# Patient Record
Sex: Male | Born: 1960 | Race: Black or African American | Hispanic: No | Marital: Single | Smoking: Never smoker
Health system: Southern US, Community
[De-identification: ages and names within clinical notes are randomized; demographics above are authoritative.]

## PROBLEM LIST (undated history)

## (undated) DIAGNOSIS — G4733 Obstructive sleep apnea (adult) (pediatric): Secondary | ICD-10-CM

## (undated) DIAGNOSIS — E785 Hyperlipidemia, unspecified: Secondary | ICD-10-CM

## (undated) DIAGNOSIS — I10 Essential (primary) hypertension: Secondary | ICD-10-CM

## (undated) DIAGNOSIS — K4090 Unilateral inguinal hernia, without obstruction or gangrene, not specified as recurrent: Secondary | ICD-10-CM

## (undated) DIAGNOSIS — N529 Male erectile dysfunction, unspecified: Secondary | ICD-10-CM

## (undated) DIAGNOSIS — Z87442 Personal history of urinary calculi: Secondary | ICD-10-CM

## (undated) HISTORY — PX: VASECTOMY: SHX75

## (undated) HISTORY — DX: Male erectile dysfunction, unspecified: N52.9

## (undated) HISTORY — DX: Personal history of urinary calculi: Z87.442

## (undated) HISTORY — DX: Hyperlipidemia, unspecified: E78.5

## (undated) HISTORY — DX: Essential (primary) hypertension: I10

## (undated) HISTORY — DX: Obstructive sleep apnea (adult) (pediatric): G47.33

---

## 1999-05-26 HISTORY — PX: OTHER SURGICAL HISTORY: SHX169

## 1999-08-25 ENCOUNTER — Ambulatory Visit (HOSPITAL_BASED_OUTPATIENT_CLINIC_OR_DEPARTMENT_OTHER): Admission: RE | Admit: 1999-08-25 | Discharge: 1999-08-25 | Payer: Self-pay | Admitting: Urology

## 1999-09-28 ENCOUNTER — Ambulatory Visit: Admission: RE | Admit: 1999-09-28 | Discharge: 1999-09-28 | Payer: Self-pay | Admitting: *Deleted

## 1999-10-27 ENCOUNTER — Ambulatory Visit (HOSPITAL_BASED_OUTPATIENT_CLINIC_OR_DEPARTMENT_OTHER): Admission: RE | Admit: 1999-10-27 | Discharge: 1999-10-28 | Payer: Self-pay | Admitting: *Deleted

## 1999-10-27 ENCOUNTER — Encounter (INDEPENDENT_AMBULATORY_CARE_PROVIDER_SITE_OTHER): Payer: Self-pay | Admitting: *Deleted

## 2000-11-30 ENCOUNTER — Ambulatory Visit (HOSPITAL_BASED_OUTPATIENT_CLINIC_OR_DEPARTMENT_OTHER): Admission: RE | Admit: 2000-11-30 | Discharge: 2000-11-30 | Payer: Self-pay | Admitting: *Deleted

## 2003-05-01 ENCOUNTER — Ambulatory Visit (HOSPITAL_COMMUNITY): Admission: RE | Admit: 2003-05-01 | Discharge: 2003-05-01 | Payer: Self-pay | Admitting: *Deleted

## 2004-09-29 ENCOUNTER — Ambulatory Visit: Payer: Self-pay | Admitting: Internal Medicine

## 2004-10-01 ENCOUNTER — Ambulatory Visit: Payer: Self-pay | Admitting: Internal Medicine

## 2006-03-02 ENCOUNTER — Ambulatory Visit: Payer: Self-pay | Admitting: Internal Medicine

## 2006-03-02 LAB — CONVERTED CEMR LAB
Albumin: 4.4 g/dL (ref 3.5–5.2)
Alkaline Phosphatase: 82 units/L (ref 39–117)
Basophils Absolute: 0.1 10*3/uL (ref 0.0–0.1)
Bilirubin Urine: NEGATIVE
Chol/HDL Ratio, serum: 7.4
Cholesterol: 292 mg/dL (ref 0–200)
Creatinine, Ser: 1.3 mg/dL (ref 0.4–1.5)
Eosinophil percent: 0.8 % (ref 0.0–5.0)
Glucose, Bld: 94 mg/dL (ref 70–99)
HCT: 45.8 % (ref 39.0–52.0)
Hemoglobin, Urine: NEGATIVE
Ketones, ur: NEGATIVE mg/dL
LDL DIRECT: 230.4 mg/dL
Leukocytes, UA: NEGATIVE
MCV: 82.1 fL (ref 78.0–100.0)
Monocytes Absolute: 0.6 10*3/uL (ref 0.2–0.7)
Neutrophils Relative %: 73.6 % (ref 43.0–77.0)
Platelets: 247 10*3/uL (ref 150–400)
RDW: 13.9 % (ref 11.5–14.6)
TSH: 1.02 microintl units/mL (ref 0.35–5.50)
Total Bilirubin: 1.3 mg/dL — ABNORMAL HIGH (ref 0.3–1.2)
Total Protein: 7.2 g/dL (ref 6.0–8.3)
Urine Glucose: NEGATIVE mg/dL
VLDL: 16 mg/dL (ref 0–40)
WBC: 11.1 10*3/uL — ABNORMAL HIGH (ref 4.5–10.5)
pH: 6 (ref 5.0–8.0)

## 2006-03-03 ENCOUNTER — Ambulatory Visit: Payer: Self-pay | Admitting: Internal Medicine

## 2009-06-19 ENCOUNTER — Ambulatory Visit: Payer: Self-pay | Admitting: Internal Medicine

## 2009-06-19 DIAGNOSIS — N529 Male erectile dysfunction, unspecified: Secondary | ICD-10-CM | POA: Insufficient documentation

## 2009-06-19 DIAGNOSIS — I10 Essential (primary) hypertension: Secondary | ICD-10-CM | POA: Insufficient documentation

## 2009-06-19 DIAGNOSIS — G4733 Obstructive sleep apnea (adult) (pediatric): Secondary | ICD-10-CM | POA: Insufficient documentation

## 2009-06-19 DIAGNOSIS — E785 Hyperlipidemia, unspecified: Secondary | ICD-10-CM | POA: Insufficient documentation

## 2009-07-24 ENCOUNTER — Encounter: Admission: RE | Admit: 2009-07-24 | Discharge: 2009-07-24 | Payer: Self-pay | Admitting: Surgery

## 2010-06-22 LAB — CONVERTED CEMR LAB
ALT: 18 units/L (ref 0–53)
AST: 18 units/L (ref 0–37)
Basophils Relative: 0.8 % (ref 0.0–3.0)
Bilirubin Urine: NEGATIVE
Creatinine, Ser: 1.3 mg/dL (ref 0.4–1.5)
Eosinophils Relative: 1.2 % (ref 0.0–5.0)
HCT: 44.7 % (ref 39.0–52.0)
Ketones, ur: NEGATIVE mg/dL
Lymphocytes Relative: 21.9 % (ref 12.0–46.0)
MCV: 80.3 fL (ref 78.0–100.0)
Monocytes Relative: 7.7 % (ref 3.0–12.0)
Neutrophils Relative %: 68.4 % (ref 43.0–77.0)
Potassium: 4 meq/L (ref 3.5–5.1)
RBC: 5.56 M/uL (ref 4.22–5.81)
Sodium: 138 meq/L (ref 135–145)
Specific Gravity, Urine: 1.015 (ref 1.000–1.030)
TSH: 1.2 microintl units/mL (ref 0.35–5.50)
Total Protein, Urine: NEGATIVE mg/dL
Total Protein: 7.4 g/dL (ref 6.0–8.3)
Urine Glucose: NEGATIVE mg/dL
WBC: 7.1 10*3/uL (ref 4.5–10.5)
pH: 7 (ref 5.0–8.0)

## 2010-06-26 NOTE — Assessment & Plan Note (Signed)
Summary: re-establish/cpx/#/cd   Vital Signs:  Patient profile:   50 year old male Height:      73 inches Weight:      234 pounds BMI:     30.98 O2 Sat:      98 % on Room air Temp:     98.6 degrees F oral Pulse rate:   72 / minute BP sitting:   132 / 92  (left arm) Cuff size:   large  Vitals Entered ByMarland Kitchen Zella Ball Ewing (June 19, 2009 9:17 AM)  O2 Flow:  Room air  Preventive Care Screening     declines flu shot todahy  CC: adult physical/Re   CC:  adult physical/Re.  History of Present Illness: overall dong well, has colon test planned for feb9;  BP has been elevated up to 150 at times;  has been tryign to follow lower chol diet recently; Pt denies CP, sob, doe, wheezing, orthopnea, pnd, worsening LE edema, palps, dizziness or syncope  Pt denies new neuro symptoms such as headache, facial or extremity weakness   Preventive Screening-Counseling & Management  Alcohol-Tobacco     Smoking Status: quit  Problems Prior to Update: None  Medications Prior to Update: 1)  None  Current Medications (verified): 1)  Cialis 20 Mg Tabs (Tadalafil) .Marland Kitchen.. 1 By Mouth Every Other Day As Needed 2)  Aspir-Low 81 Mg Tbec (Aspirin) .Marland Kitchen.. 1po Once Daily 3)  Losartan Potassium 50 Mg Tabs (Losartan Potassium) .Marland Kitchen.. 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 sister with breast cancer - died at 77yo mother died with stroke at 17yo; HTN, glaucoma father died at 66yo - [place]  Social History: Reviewed history and no changes required. 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         all otherwise negative per pt -   Physical Exam  General:  alert and overweight-appearing.   Head:  normocephalic and atraumatic.   Eyes:  vision grossly intact, pupils equal, and pupils round.   Ears:  R ear normal and L ear normal.   Nose:  no external deformity and no nasal discharge.   Mouth:  no gingival abnormalities and pharynx pink and moist.   Neck:  supple and no masses.   Lungs:  normal respiratory effort and normal breath sounds.   Heart:  normal rate and regular rhythm.   Abdomen:  soft, non-tender, and  normal bowel sounds.   Msk:  no joint tenderness and no joint swelling.   Extremities:  no edema, no erythema  Neurologic:  cranial nerves II-XII intact and strength normal in all extremities.     Impression & Recommendations:  Problem # 1:  Preventive Health Care (ICD-V70.0) Overall doing well, age appropriate education and counseling updated and referral for appropriate preventive services done unless declined, immunizations up to date or declined, diet counseling done if  overweight, urged to quit smoking if smokes , most recent labs reviewed and current ordered if appropriate, ecg reviewed or declined (interpretation per ECG scanned in the EMR if done); information regarding Medicare Prevention requirements given if appropriate  Orders: EKG w/ Interpretation (93000)  Problem # 2:  HYPERTENSION (ICD-401.9)  BP today: 132/92  Labs Reviewed: K+: 3.5 (03/02/2006) Creat: : 1.3 (03/02/2006)   Chol: 292 (03/02/2006)   HDL: 39.6 (03/02/2006)   TG: 78 (03/02/2006) to start the losartan 50 mg per day  His updated medication list for this problem includes:    Losartan Potassium 50 Mg Tabs (Losartan potassium) .Marland Kitchen... 1 by mouth once daily  Problem # 3:  HYPERLIPIDEMIA (ICD-272.4) conisder start lipitor; but will need f/u of labs drawn earlier today  Problem # 4:  ERECTILE DYSFUNCTION, ORGANIC (ICD-607.84)  His updated medication list for this problem includes:    Cialis 20 Mg Tabs (Tadalafil) .Marland Kitchen... 1 by mouth every other day as needed treat as above, f/u any worsening signs or symptoms   Complete Medication List: 1)  Cialis 20 Mg Tabs (Tadalafil) .Marland Kitchen.. 1 by mouth every other day as needed 2)  Aspir-low 81 Mg Tbec (Aspirin) .Marland Kitchen.. 1po once daily 3)  Losartan Potassium 50 Mg Tabs (Losartan potassium) .Marland Kitchen.. 1 by mouth once daily  Patient Instructions: 1)  please make appt with opthomology for vision and glaucoma check yearly 2)  please call the number on the blue card for the lab results 3)  we'll call if it seems you need the lipitor 4)  please keep your appt with GI feb 9 for the colonoscopy as planned with Dr Madilyn Fireman 5)  Take an Aspirin every day - 81 mg - 1 per day - COATED only 6)  start the losartan 50 mg per day 7)  Continue all previous medications as before this visit  8)  Please schedule a follow-up appointment in 1 yr  for yearly exam, or [place] if needed 9)  Check your Blood Pressure regularly. If it is above 140/90: you should make an  appointment. Prescriptions: LOSARTAN POTASSIUM 50 MG TABS (LOSARTAN POTASSIUM) 1 by mouth once daily  #90 x 3   Entered and Authorized by:   Corwin Levins MD   Signed by:   Corwin Levins MD on 06/19/2009   Method used:   Print then Give to Patient   RxID:   [id number] CIALIS 20 MG TABS (TADALAFIL) 1 by mouth every other day as needed  #5 x 11   Entered and Authorized by:   Corwin Levins MD   Signed by:   Corwin Levins MD on 06/19/2009   Method used:   Print then Give to Patient   RxID:   [id number] CIALIS 20 MG TABS (TADALAFIL) 1 by mouth every other day as needed  #3 x 0   Entered and Authorized by:   Corwin Levins MD   Signed by:   Corwin Levins MD on 06/19/2009   Method used:   Print then  Give to Patient   RxID:   [id number]    Immunization History:  Tetanus/Td Immunization History:    Tetanus/Td:  historical (05/25/2001)  Pneumovax Immunization History:    Pneumovax:  historical (05/25/2001)

## 2010-06-27 ENCOUNTER — Ambulatory Visit: Admit: 2010-06-27 | Payer: Self-pay | Admitting: Internal Medicine

## 2010-06-27 ENCOUNTER — Other Ambulatory Visit: Payer: Self-pay

## 2010-07-02 ENCOUNTER — Other Ambulatory Visit: Payer: Self-pay | Admitting: Internal Medicine

## 2010-07-02 ENCOUNTER — Other Ambulatory Visit: Payer: 59

## 2010-07-02 ENCOUNTER — Encounter (INDEPENDENT_AMBULATORY_CARE_PROVIDER_SITE_OTHER): Payer: Self-pay | Admitting: *Deleted

## 2010-07-02 DIAGNOSIS — Z1289 Encounter for screening for malignant neoplasm of other sites: Secondary | ICD-10-CM

## 2010-07-02 DIAGNOSIS — Z0000 Encounter for general adult medical examination without abnormal findings: Secondary | ICD-10-CM

## 2010-07-02 DIAGNOSIS — E785 Hyperlipidemia, unspecified: Secondary | ICD-10-CM

## 2010-07-02 LAB — HEPATIC FUNCTION PANEL
AST: 22 U/L (ref 0–37)
Albumin: 4.2 g/dL (ref 3.5–5.2)
Alkaline Phosphatase: 75 U/L (ref 39–117)
Bilirubin, Direct: 0.1 mg/dL (ref 0.0–0.3)
Total Bilirubin: 1.1 mg/dL (ref 0.3–1.2)

## 2010-07-02 LAB — LIPID PANEL: VLDL: 15.4 mg/dL (ref 0.0–40.0)

## 2010-07-02 LAB — CBC WITH DIFFERENTIAL/PLATELET
Basophils Absolute: 0 10*3/uL (ref 0.0–0.1)
Eosinophils Absolute: 0.1 10*3/uL (ref 0.0–0.7)
Eosinophils Relative: 1.1 % (ref 0.0–5.0)
HCT: 44.2 % (ref 39.0–52.0)
Hemoglobin: 14.7 g/dL (ref 13.0–17.0)
MCHC: 33.3 g/dL (ref 30.0–36.0)
MCV: 80 fl (ref 78.0–100.0)
Monocytes Absolute: 0.6 10*3/uL (ref 0.1–1.0)
Monocytes Relative: 5.6 % (ref 3.0–12.0)
RBC: 5.52 Mil/uL (ref 4.22–5.81)
WBC: 9.9 10*3/uL (ref 4.5–10.5)

## 2010-07-02 LAB — URINALYSIS
Leukocytes, UA: NEGATIVE
Total Protein, Urine: NEGATIVE
Urine Glucose: NEGATIVE
Urobilinogen, UA: 0.2 (ref 0.0–1.0)
pH: 6 (ref 5.0–8.0)

## 2010-07-02 LAB — BASIC METABOLIC PANEL
BUN: 22 mg/dL (ref 6–23)
CO2: 25 mEq/L (ref 19–32)
Calcium: 9.5 mg/dL (ref 8.4–10.5)
Creatinine, Ser: 1.2 mg/dL (ref 0.4–1.5)

## 2010-07-02 LAB — LDL CHOLESTEROL, DIRECT: Direct LDL: 143.8 mg/dL

## 2010-07-02 LAB — PSA: PSA: 0.83 ng/mL (ref 0.10–4.00)

## 2010-07-04 ENCOUNTER — Encounter: Payer: Self-pay | Admitting: Internal Medicine

## 2010-07-04 ENCOUNTER — Encounter (INDEPENDENT_AMBULATORY_CARE_PROVIDER_SITE_OTHER): Payer: 59 | Admitting: Internal Medicine

## 2010-07-04 DIAGNOSIS — Z0000 Encounter for general adult medical examination without abnormal findings: Secondary | ICD-10-CM

## 2010-07-10 NOTE — Assessment & Plan Note (Signed)
Summary: PHYSICAL--STC   Vital Signs:  Patient profile:   50 year old male Height:      73 inches (185.42 cm) Weight:      245.0 pounds (111.36 kg) BMI:     32.44 O2 Sat:      97 % on Room air Temp:     98.2 degrees F (36.78 degrees C) oral Pulse rate:   69 / minute BP sitting:   118 / 82  (left arm) Cuff size:   large  Vitals Entered By: Orlan Leavens RMA (July 04, 2010 1:22 PM)  O2 Flow:  Room air  Preventive Care Screening  Colonoscopy:    Date:  08/23/2009    Next Due:  08/2019    Results:  normal   CC: CPX Is Patient Diabetic? No Pain Assessment Patient in pain? no        CC:  CPX.  History of Present Illness: here for wellness and f/u;  overall doing ok;  Pt denies CP, worsening sob, doe, wheezing, orthopnea, pnd, worsening LE edema, palps, dizziness or syncope  Pt denies new neuro symptoms such as headache, facial or extremity weakness  Pt denies polydipsia, polyuria,  Overall good compliance with meds, trying to follow low chol diet, wt stable, little excercise however .Overall good compliance with meds, and good tolerability, except admits to some noncompliacne with the lipitor..  No fever, wt loss, night sweats, loss of appetite or other constitutional symptoms  Denies worsening depressive symptoms, suicidal ideation, or panic.   Pt states good ability with ADL's, low fall risk, home safety reviewed and adequate, no significant change in hearing or vision, trying to follow lower chol diet, and occasionally active only with regular excercise.  Not taking the ASA as well.   Preventive Screening-Counseling & Management      Drug Use:  no.    Problems Prior to Update: 1)  Special Screening Malig Neoplasms Other Sites  (ICD-V76.49) 2)  Preventive Health Care  (ICD-V70.0) 3)  Erectile Dysfunction, Organic  (ICD-607.84) 4)  Hypertension  (ICD-401.9) 5)  Hyperlipidemia  (ICD-272.4) 6)  Sleep Apnea, Obstructive  (ICD-327.23)  Medications Prior to Update: 1)   Cialis 20 Mg Tabs (Tadalafil) .Marland Kitchen.. 1 By Mouth Every Other Day As Needed 2)  Aspir-Low 81 Mg Tbec (Aspirin) .Marland Kitchen.. 1po Once Daily 3)  Losartan Potassium 50 Mg Tabs (Losartan Potassium) .Marland Kitchen.. 1 By Mouth Once Daily 4)  Lipitor 20 Mg Tabs (Atorvastatin Calcium) .Marland Kitchen.. 1 By Mouth Once Daily  Current Medications (verified): 1)  Cialis 20 Mg Tabs (Tadalafil) .Marland Kitchen.. 1 By Mouth Every Other Day As Needed 2)  Aspir-Low 81 Mg Tbec (Aspirin) .Marland Kitchen.. 1po Once Daily 3)  Losartan Potassium 50 Mg Tabs (Losartan Potassium) .Marland Kitchen.. 1 By Mouth Once Daily 4)  Lipitor 20 Mg Tabs (Atorvastatin Calcium) .Marland Kitchen.. 1 By Mouth Once Daily  Allergies (verified): No Known Drug Allergies  Past History:  Past Medical History: Last updated: 06/19/2009 OSA - s/p surgury Hyperlipidemia Hypertension erectile dysfunction   Past Surgical History: Last updated: 06/19/2009 s/p deviated nasal septum 2001 s/p uvulopaltoplasty 2001 Vasectomy - dr Brunilda Payor  Family History: Last updated: 06/19/2009 sister with breast cancer - died at 63yo mother died with stroke at 54yo; HTN, glaucoma father died at 21yo - [place]  Social History: Last updated: 07/04/2010 Former Smoker - in high school only Alcohol use-yes - rare Divorced/girlfriend no children 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[employer])  Risk Factors: Smoking Status: quit (06/19/2009)  Social History: Former Smoker - in high school only Alcohol use-yes - rare Divorced/girlfriend no children retired Psychologist, 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         all otherwise negative per pt -    Physical Exam  General:  alert and overweight-appearing.   Head:  normocephalic and atraumatic.   Eyes:  vision grossly intact, pupils equal, and pupils round.   Ears:  R ear normal and L ear normal.   Nose:  no external deformity and no nasal discharge.   Mouth:  no gingival abnormalities and pharynx pink and moist.   Neck:  supple and no masses.   Lungs:  normal respiratory effort and normal breath sounds.   Heart:  normal rate and regular rhythm.   Abdomen:  soft, non-tender, and normal bowel sounds.   Msk:  no joint tenderness and no joint swelling.   Extremities:  no edema, no erythema  Neurologic:  cranial nerves II-XII intact and strength normal in all extremities.   Skin:  color normal and no rashes.   Psych:  not anxious appearing and not depressed appearing.     Impression & Recommendations:  Problem # 1:  Preventive Health Care (ICD-V70.0)  Overall doing well, age appropriate education and counseling updated, referral for preventive services and immunizations addressed, dietary counseling and smoking status adressed , most recent labs reviewed, ecg reviewed I have personally reviewed and have noted 1.The patient's medical and social history 2.Their use of alcohol, tobacco or illicit drugs 3.Their current medications and supplements 4. Functional ability including ADL's, fall risk, home safety risk, hearing & visual impairment  5.Diet and physical activities 6.Evidence for depression or mood disorders The patients weight, height, BMI  have been recorded in the chart I have made referrals, counseling and provided education to the patient based review of the above   Orders: EKG w/ Interpretation (93000)  Complete Medication List: 1)  Cialis 20 Mg Tabs (Tadalafil) .Marland Kitchen.. 1 by mouth every  other day as needed 2)  Aspir-low 81 Mg Tbec (Aspirin) .Marland Kitchen.. 1po once daily 3)  Losartan Potassium 50 Mg Tabs (Losartan potassium) .Marland Kitchen.. 1 by mouth once daily 4)  Lipitor 20 Mg Tabs (Atorvastatin calcium) .Marland Kitchen.. 1 by mouth once daily  Patient Instructions: 1)  Continue all previous medications as before this visit 2)  Take an Aspirin every day - 81 mg - 1 per day - COATED only 3)  You are given the refills today 4)  Please schedule a follow-up appointment in 1 year, or sooner if needed 5)  Good Luck with your new job! Prescriptions: CIALIS 20 MG TABS (TADALAFIL) 1 by mouth every other day as needed  #5 x 11   Entered and Authorized by:   Corwin Levins MD   Signed by:   Corwin Levins MD on 07/04/2010   Method used:   Print then Give to Patient   RxID:   [id number] LOSARTAN POTASSIUM 50 MG TABS (LOSARTAN POTASSIUM) 1 by mouth once daily  #90 x 3   Entered  and Authorized by:   Corwin Levins MD   Signed by:   Corwin Levins MD on 07/04/2010   Method used:   Print then Give to Patient   RxID:   [id number] LIPITOR 20 MG TABS (ATORVASTATIN CALCIUM) 1 by mouth once daily  #90 x 3   Entered and Authorized by:   Corwin Levins MD   Signed by:   Corwin Levins MD on 07/04/2010   Method used:   Print then Give to Patient   RxID:   [id number]    Orders Added: 1)  EKG w/ Interpretation [93000] 2)  Est. Patient 40-64 years [36644]

## 2010-10-10 NOTE — Op Note (Signed)
[place]. [facility]  Patient:    Alejandro Middleton, Alejandro Middleton                      MRN: [id number] Proc. Date: 08/25/99 Attending:  Lindaann Slough, M.D.                           Operative Report  PREOPERATIVE DIAGNOSIS:  Redundant foreskin.  POSTOPERATIVE DIAGNOSIS:  Redundant foreskin.  PROCEDURE:  Circumcision.  SURGEON:  Lindaann Slough, M.D.  ANESTHESIA:  General.  INDICATIONS:  The patient is a 50 year old male who has been complaining of irritation of his foreskin.  He has not been circumcised.  He would like to have it done.  He is scheduled today for the procedure.  DESCRIPTION OF PROCEDURE:  Under general anesthesia the patient was prepped and  draped and placed in the supine position.  Two circumferential incisions were made on the foreskin and the foreskin in between those two incisions was excised. Hemostasis was secured with electrocautery.  The skin approximation was then done with #4-0 chromic.  A penile block was then done with 0.25% Marcaine.  The patient tolerated the procedure well and left the operating room in satisfactory condition to the post anesthesia care unit. DD:  08/25/99 TD:  08/25/99 Job: [id number]

## 2010-10-10 NOTE — Op Note (Signed)
[place]. [facility]  Patient:    Alejandro Middleton, Alejandro Middleton                       MRN: [id number] Proc. Date: 10/27/99 Adm. Date:  [id number] Attending:  Claudina Lick                           Operative Report  PREOPERATIVE DIAGNOSES: 1. Obstructive sleep apnea syndrome. 2. Deviated nasal septum. 3. Nasal turbinate hypertrophy.  POSTOPERATIVE DIAGNOSES: 1. Obstructive sleep apnea syndrome. 2. Deviated nasal septum. 3. Nasal turbinate hypertrophy.  OPERATIONS PERFORMED: 1. Uvulopalatoplasty. 2. Nasal septoplasty. 3. Submucous resection right inferior nasal turbinate.  SURGEON:  Robert L. Lyman Bishop, M.D.  ANESTHESIA:  General.  PROCEDURE:  A 50 year old black male complaining of history of snoring. Examination showed a marked septal deviation to the left with enlarged right inferior nasal turbinate.  Had a thickened soft palate and uvula, which was also elongated and a sleep study showed moderately severe obstructive sleep apnea.  Patient admitted for surgery.  After satisfactory general endotracheal anesthesia had been induced, topical epinephrine packs were placed intranasally, afterwhich, the nasal septum, the right inferior nasal turbinate were infiltrated with 1% Xylocaine containing 1:100,000 epinephrine for hemostasis.  This was also infiltrated into the lower soft palate.  Nose and face were prepped with Betadine and sterile drapes applied.  A left anterior septal incision was made.  The mucoperichondrium periosteum elevated initially on the left side.  The deflected inferior border of the cartilage was dissected up from the maxillary crest and the cartilage then partially separated from the bony septum and the mucoperiosteal flap on the right side posteriorly was then elevated.  Deviated portion of the perpendicular plate along with a vomerine spur was removed with Jansen-Middleton bone biting forceps.  The inferior border of the  cartilage was trimmed removing the portion displaced to the left and the small spur from the left side of the maxillary crest was removed with a chisel.  Posterior septal flaps reapproximated with mattress suture of 4-0 plain gut.  The cartilage was stabilized in the midline over the maxillary crest with two mattress sutures of 4-0 plain gut placed in the [facility] technique.  Afterwhich, the septal incision was closed with running 5-0 chromic gut.  The left inferior nasal turbinate was crushed and lateralized.  A small incision made over the anterior end of the right inferior nasal turbinate.  The mucoperiosteum elevated, excess turbinate bone removed and the incision closed with a running 5-0 chromic gut.  Each side of the nose was then packed with folded strip of Telfa gauze coated with Cortisporin ointment.  A Crowe-Davis mouthgag was then inserted and suspended from the Mayo stand.  Patient had moderately enlarged, cryptic tonsils with thickened, elongated uvula.  An incision was made across the soft palate extending from the superior pole of the left tonsil to the superior pole of the right tonsil and the lower 2-3 mm of soft palate along with the entire uvula was excised with a scalpel and the anterior and posterior mucosal margins were sutured together with a running 4-0 chromic gut.  Depo Medrol 40 mg was infiltrated into the remaining soft palate.  Patient was given 1 g of Ancef IV intraoperative.  Estimated blood loss 15-20 cc.  Patient tolerated the procedure well, was awakened from anesthesia and taken to the recovery room in satisfactory condition. DD:  10/27/99 TD:  10/29/99 Job: [id number]

## 2010-10-30 ENCOUNTER — Other Ambulatory Visit (HOSPITAL_COMMUNITY): Payer: Self-pay | Admitting: Urology

## 2010-10-30 DIAGNOSIS — K8020 Calculus of gallbladder without cholecystitis without obstruction: Secondary | ICD-10-CM

## 2010-11-04 ENCOUNTER — Ambulatory Visit (HOSPITAL_COMMUNITY)
Admission: RE | Admit: 2010-11-04 | Discharge: 2010-11-04 | Disposition: A | Discharge status: Home / Self Care | Payer: 59 | Source: Ambulatory Visit | Attending: Urology | Admitting: Urology

## 2010-11-04 ENCOUNTER — Other Ambulatory Visit (HOSPITAL_COMMUNITY): Payer: 59

## 2010-11-04 DIAGNOSIS — K8020 Calculus of gallbladder without cholecystitis without obstruction: Secondary | ICD-10-CM

## 2010-11-04 DIAGNOSIS — R1011 Right upper quadrant pain: Secondary | ICD-10-CM | POA: Insufficient documentation

## 2010-11-04 DIAGNOSIS — D1803 Hemangioma of intra-abdominal structures: Secondary | ICD-10-CM | POA: Insufficient documentation

## 2010-12-11 ENCOUNTER — Encounter: Payer: Self-pay | Admitting: Internal Medicine

## 2010-12-11 ENCOUNTER — Telehealth: Payer: Self-pay | Admitting: *Deleted

## 2010-12-11 NOTE — Telephone Encounter (Signed)
We have no documentation regarding CAD or PVD  Please clarify how this diagnosis was made, so that referral can be done and we can obtain records

## 2010-12-11 NOTE — Telephone Encounter (Signed)
Request for referral to Cardiology [clogged arteries]

## 2010-12-12 ENCOUNTER — Ambulatory Visit (INDEPENDENT_AMBULATORY_CARE_PROVIDER_SITE_OTHER)
Admission: RE | Admit: 2010-12-12 | Discharge: 2010-12-12 | Disposition: A | Discharge status: Home / Self Care | Payer: 59 | Source: Ambulatory Visit | Attending: Internal Medicine | Admitting: Internal Medicine

## 2010-12-12 ENCOUNTER — Ambulatory Visit (INDEPENDENT_AMBULATORY_CARE_PROVIDER_SITE_OTHER): Payer: 59 | Admitting: Internal Medicine

## 2010-12-12 ENCOUNTER — Encounter: Payer: Self-pay | Admitting: Internal Medicine

## 2010-12-12 DIAGNOSIS — E785 Hyperlipidemia, unspecified: Secondary | ICD-10-CM

## 2010-12-12 DIAGNOSIS — R079 Chest pain, unspecified: Secondary | ICD-10-CM | POA: Insufficient documentation

## 2010-12-12 DIAGNOSIS — Z0000 Encounter for general adult medical examination without abnormal findings: Secondary | ICD-10-CM

## 2010-12-12 DIAGNOSIS — I10 Essential (primary) hypertension: Secondary | ICD-10-CM

## 2010-12-12 DIAGNOSIS — Z0001 Encounter for general adult medical examination with abnormal findings: Secondary | ICD-10-CM | POA: Insufficient documentation

## 2010-12-12 MED ORDER — LOSARTAN POTASSIUM 50 MG PO TABS: 50.0000 mg | ORAL_TABLET | Freq: Every day | ORAL | Status: DC

## 2010-12-12 MED ORDER — ATORVASTATIN CALCIUM 40 MG PO TABS: 40.0000 mg | ORAL_TABLET | Freq: Every day | ORAL | Status: DC

## 2010-12-12 NOTE — Patient Instructions (Addendum)
Please increase the lipitor to 40 mg per day Continue all other medications as before, including the losartan Your EKG was good today Please go to XRAY in the Basement for the x-ray test Please call the phone number [phone] (the PhoneTree System) for results of testing in 2-3 days;  When calling, simply dial the number, and when prompted enter the MRN number above (the Medical Record Number) and the # key, then the message should start. You will be contacted regarding the referral for: Cardiology Please return in Feb 2013  for your yearly visit, or sooner if needed, with Lab testing done 3-5 days before

## 2010-12-12 NOTE — Progress Notes (Signed)
Quick Note:  Voice message left on PhoneTree system - lab is negative, normal or otherwise stable, pt to continue same tx ______ 

## 2010-12-12 NOTE — Assessment & Plan Note (Addendum)
Uncontrolled - for incr lipitor to 40 mg, cont diet, f/u labs  Last ldl 143 on 20 mg lipitor

## 2010-12-12 NOTE — Assessment & Plan Note (Signed)
stable overall by hx and exam, most recent data reviewed with pt, and pt to continue medical treatment as before  BP Readings from Last 3 Encounters:  12/12/10 122/84  07/04/10 118/82  06/19/09 132/92

## 2010-12-12 NOTE — Telephone Encounter (Signed)
Called the patient he has had no test done. He is just worried and would like to see a cardiologist.

## 2010-12-12 NOTE — Assessment & Plan Note (Signed)
Atypical- ? MSK vs other;  ecg reviewed - sinus with NSSTTW changes, also for CXR, and pt requests card referral

## 2010-12-12 NOTE — Progress Notes (Signed)
  Subjective:    Patient ID: Alejandro Middleton, male    DOB: Jan 21, 1961, 50 y.o.   MRN: [id number]  HPI  Here to f/u;  Phoned in request yesterday that did not mention CP, and wanted "cardiology tests."  Today states he has been lately very concerned about his CV risk given his risk factors and age;  States has had last wk intermittent left upper chest discomfort, dull and sharp without radiation, n/v, sob, diaphoresis, palps, dizzines, syncope, non exertional but nonpleuritic as well.  Nothing seems to make worse or better, and distinct from typical reflux.  Pt denies wheezing, orthopnea, PND, increased LE swelling.  Pt denies new neurological symptoms such as new headache, or facial or extremity weakness or numbness    Pt denies polydipsia, polyuria, or low sugar symptoms such as weakness or confusion improved with po intake.  Pt states overall good compliance with meds, trying to follow lower cholesterol diet, wt overall stable but little exercise however.    Past Medical History  Diagnosis Date  . Hyperlipidemia   . Hypertension   . Erectile dysfunction   . OSA (obstructive sleep apnea)     s/p surgery   Past Surgical History  Procedure Date  . S/p deviated nasal septum 2001  . S/p uvulopaltoplasty 2001  . Vasectomy     Dr. Brunilda Payor    reports that he has quit smoking. He does not have any smokeless tobacco history on file. He reports that he drinks alcohol. He reports that he does not use illicit drugs. family history includes Cancer in his sister; Glaucoma in his mother; Heart attack (age of onset:49) in his father; Hypertension in his mother; and Stroke (age of onset:90) in his mother. No Known Allergies Current Outpatient Prescriptions on File Prior to Visit  Medication Sig Dispense Refill  . aspirin 81 MG EC tablet Take 81 mg by mouth daily.        . tadalafil (CIALIS) 20 MG tablet Take 20 mg by mouth every other day as needed.         Review of Systems Review of Systems    Constitutional: Negative for diaphoresis and unexpected weight change.  HENT: Negative for drooling and tinnitus.   Eyes: Negative for photophobia and visual disturbance.  Respiratory: Negative for choking and stridor.   Gastrointestinal: Negative for vomiting and blood in stool.       Objective:   Physical Exam BP 122/84  Pulse 72  Temp(Src) 98.7 F (37.1 C) (Oral)  Ht 6\' 1"  (1.854 m)  Wt 246 lb 4 oz (111.698 kg)  BMI 32.49 kg/m2  SpO2 95% Physical Exam  VS noted Constitutional: Pt appears well-developed and well-nourished.  HENT: Head: Normocephalic.  Right Ear: External ear normal.  Left Ear: External ear normal.  Eyes: Conjunctivae and EOM are normal. Pupils are equal, round, and reactive to light.  Neck: Normal range of motion. Neck supple.  Cardiovascular: Normal rate and regular rhythm.   Pulmonary/Chest: Effort normal and breath sounds normal.  Abd:  Soft, NT, non-distended, + BS Neurological: Pt is alert. No cranial nerve deficit.  Skin: Skin is warm. No erythema.  Psychiatric: Pt behavior is normal. Thought content normal.         Assessment & Plan:

## 2011-01-08 ENCOUNTER — Encounter: Payer: Self-pay | Admitting: Cardiology

## 2011-01-08 ENCOUNTER — Ambulatory Visit (INDEPENDENT_AMBULATORY_CARE_PROVIDER_SITE_OTHER): Payer: 59 | Admitting: Cardiology

## 2011-01-08 DIAGNOSIS — E785 Hyperlipidemia, unspecified: Secondary | ICD-10-CM

## 2011-01-08 DIAGNOSIS — R079 Chest pain, unspecified: Secondary | ICD-10-CM

## 2011-01-08 DIAGNOSIS — I10 Essential (primary) hypertension: Secondary | ICD-10-CM

## 2011-01-08 NOTE — Progress Notes (Signed)
HPI: 50 year old male with no prior cardiac history for evaluation of chest pain. Several weeks ago the patient had an episode of chest pain. He had moved a piece of furniture. He then developed a sharp pain in the left upper chest. It lasted for 15 minutes and resolved spontaneously. It was not pleuritic, positional or related to food. The pain did not radiate. There was mild nausea and diaphoresis but no shortness of breath. The following day he had milder pain. He said no symptoms since. He presently denies dyspnea on exertion, orthopnea, PND, pedal edema, palpitations or syncope. No exertional chest pain. Because of the above we were asked to further evaluate.  Current Outpatient Prescriptions  Medication Sig Dispense Refill  . aspirin 81 MG EC tablet Take 81 mg by mouth daily.        Marland Kitchen atorvastatin (LIPITOR) 40 MG tablet Take 1 tablet (40 mg total) by mouth daily.  90 tablet  3  . losartan (COZAAR) 50 MG tablet Take 1 tablet (50 mg total) by mouth daily.  90 tablet  3  . VIAGRA 100 MG tablet         No Known Allergies  Past Medical History  Diagnosis Date  . Hyperlipidemia   . Hypertension   . Erectile dysfunction   . OSA (obstructive sleep apnea)     s/p surgery    Past Surgical History  Procedure Date  . S/p deviated nasal septum 2001  . S/p uvulopaltoplasty 2001  . Vasectomy     Dr. Brunilda Payor    History   Social History  . Marital Status: Single    Spouse Name: N/A    Number of Children: 1  . Years of Education: N/A   Occupational History  . 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 Smoking status: Former Games developer  . Smokeless tobacco: Not on file   Comment: high school only  . Alcohol Use: Yes     rare  . Drug Use: No  . Sexually Active: Not on file   Other Topics Concern  . Not on file   Social History Narrative   Divorced/girlfriend    Family History  Problem  Relation Age of Onset  . Cancer Sister     breast  . Hypertension Mother   . Glaucoma Mother   . Stroke Mother 46  . Heart attack Father 49    ROS: no fevers or chills, productive cough, hemoptysis, dysphasia, odynophagia, melena, hematochezia, dysuria, hematuria, rash, seizure activity, orthopnea, PND, pedal edema, claudication. Remaining systems are negative.  Physical Exam: General:  Well developed/well nourished in NAD Skin warm/dry Patient not depressed No peripheral clubbing Back-normal HEENT-normal/normal eyelids Neck supple/normal carotid upstroke bilaterally; no bruits; no JVD; no thyromegaly chest - CTA/ normal expansion CV - RRR/normal S1 and S2; no murmurs, rubs or gallops;  PMI nondisplaced Abdomen -NT/ND, no HSM, no mass, + bowel sounds, no bruit 2+ femoral pulses, no bruits Ext-no edema, chords, 2+ DP Neuro-grossly nonfocal  ECG Sinus brady with nonspecific ST changes.

## 2011-01-08 NOTE — Patient Instructions (Signed)
Your physician has requested that you have a stress echocardiogram. For further information please visit [email or web address]. Please follow instruction sheet as given.   

## 2011-01-08 NOTE — Assessment & Plan Note (Signed)
Symptoms atypical but multiple risk factors including strong family history. Schedule stress echocardiogram.

## 2011-01-08 NOTE — Assessment & Plan Note (Signed)
Blood pressure elevated. Continue present medications. Monitor at home and increase as needed. Management per primary care.

## 2011-01-08 NOTE — Assessment & Plan Note (Signed)
Continue statin. Managed by primary care. 

## 2011-01-14 ENCOUNTER — Ambulatory Visit (HOSPITAL_BASED_OUTPATIENT_CLINIC_OR_DEPARTMENT_OTHER): Payer: 59 | Admitting: Radiology

## 2011-01-14 ENCOUNTER — Ambulatory Visit (HOSPITAL_COMMUNITY): Payer: 59 | Attending: Cardiology

## 2011-01-14 DIAGNOSIS — R072 Precordial pain: Secondary | ICD-10-CM | POA: Insufficient documentation

## 2011-01-14 DIAGNOSIS — R0989 Other specified symptoms and signs involving the circulatory and respiratory systems: Secondary | ICD-10-CM

## 2011-01-14 DIAGNOSIS — I10 Essential (primary) hypertension: Secondary | ICD-10-CM | POA: Insufficient documentation

## 2011-01-14 DIAGNOSIS — E785 Hyperlipidemia, unspecified: Secondary | ICD-10-CM | POA: Insufficient documentation

## 2011-01-14 DIAGNOSIS — R5381 Other malaise: Secondary | ICD-10-CM | POA: Insufficient documentation

## 2011-01-14 DIAGNOSIS — R0609 Other forms of dyspnea: Secondary | ICD-10-CM | POA: Insufficient documentation

## 2011-01-20 ENCOUNTER — Encounter: Payer: Self-pay | Admitting: *Deleted

## 2011-01-20 ENCOUNTER — Encounter: Payer: Self-pay | Admitting: Cardiology

## 2011-01-20 ENCOUNTER — Ambulatory Visit (INDEPENDENT_AMBULATORY_CARE_PROVIDER_SITE_OTHER): Payer: 59 | Admitting: Cardiology

## 2011-01-20 ENCOUNTER — Ambulatory Visit: Payer: 59 | Admitting: Cardiology

## 2011-01-20 DIAGNOSIS — R9430 Abnormal result of cardiovascular function study, unspecified: Secondary | ICD-10-CM

## 2011-01-20 DIAGNOSIS — I10 Essential (primary) hypertension: Secondary | ICD-10-CM

## 2011-01-20 DIAGNOSIS — Z01810 Encounter for preprocedural cardiovascular examination: Secondary | ICD-10-CM

## 2011-01-20 DIAGNOSIS — R079 Chest pain, unspecified: Secondary | ICD-10-CM

## 2011-01-20 DIAGNOSIS — E785 Hyperlipidemia, unspecified: Secondary | ICD-10-CM

## 2011-01-20 NOTE — Assessment & Plan Note (Signed)
Continue statin. 

## 2011-01-20 NOTE — Patient Instructions (Addendum)
Your physician recommends that you schedule a follow-up appointment in: 6 WEEKS  A chest x-ray takes a picture of the organs and structures inside the chest, including the heart, lungs, and blood vessels. This test can show several things, including, whether the heart is enlarges; whether fluid is building up in the lungs; and whether pacemaker / defibrillator leads are still in place. [address]  Your physician has requested that you have a cardiac catheterization. Cardiac catheterization is used to diagnose and/or treat various heart conditions. Doctors may recommend this procedure for a number of different reasons. The most common reason is to evaluate chest pain. Chest pain can be a symptom of coronary artery disease (CAD), and cardiac catheterization can show whether plaque is narrowing or blocking your heart's arteries. This procedure is also used to evaluate the valves, as well as measure the blood flow and oxygen levels in different parts of your heart. For further information please visit [email or web address] Please follow instruction sheet, as given.    Your physician recommends that you return for lab work in: AT [address]

## 2011-01-20 NOTE — Assessment & Plan Note (Signed)
Patient's stress echocardiogram demonstrated a wall motion abnormality in the inferobasal wall and electrocardiographic changes. I have discussed the options with the patient and we plan to proceed with cardiac catheterization. Risks and benefits have been discussed and the patient agrees to proceed. Continue aspirin and statin.

## 2011-01-20 NOTE — Assessment & Plan Note (Signed)
No further symptoms. Given the abnormal stress echocardiogram proceed with cardiac catheterization.

## 2011-01-20 NOTE — Assessment & Plan Note (Signed)
Blood pressure controlled. Continue present medications. 

## 2011-01-20 NOTE — Progress Notes (Signed)
HPI: 50 year old male I initially saw in August of 2012 with atypical chest pain. Stress echo in August of 2012 revealed ECG abnormalities and inferobasal hpokinesis. Since he was last seen, he denies dyspnea on exertion, orthopnea, PND, pedal edema, chest pain or syncope.  Current Outpatient Prescriptions  Medication Sig Dispense Refill  . aspirin 81 MG EC tablet Take 81 mg by mouth daily.        Marland Kitchen atorvastatin (LIPITOR) 40 MG tablet Take 1 tablet (40 mg total) by mouth daily.  90 tablet  3  . losartan (COZAAR) 50 MG tablet Take 1 tablet (50 mg total) by mouth daily.  90 tablet  3  . VIAGRA 100 MG tablet          Past Medical History  Diagnosis Date  . Hyperlipidemia   . Hypertension   . Erectile dysfunction   . OSA (obstructive sleep apnea)     s/p surgery    Past Surgical History  Procedure Date  . S/p deviated nasal septum 2001  . S/p uvulopaltoplasty 2001  . Vasectomy     Dr. Brunilda Payor    History   Social History  . Marital Status: Single    Spouse Name: N/A    Number of Children: 1  . Years of Education: N/A   Occupational History  . 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 Smoking status: Former Games developer  . Smokeless tobacco: Not on file   Comment: high school only  . Alcohol Use: Yes     rare  . Drug Use: No  . Sexually Active: Not on file   Other Topics Concern  . Not on file   Social History Narrative   Divorced/girlfriend    ROS: no fevers or chills, productive cough, hemoptysis, dysphasia, odynophagia, melena, hematochezia, dysuria, hematuria, rash, seizure activity, orthopnea, PND, pedal edema, claudication. Remaining systems are negative.  Physical Exam: Well-developed well-nourished in no acute distress.  Skin is warm and dry.  HEENT is normal.  Neck is supple. No thyromegaly.  Chest is clear to auscultation with normal expansion.  Cardiovascular exam  is regular rate and rhythm.  Abdominal exam nontender or distended. No masses palpated. Extremities show no edema. neuro grossly intact

## 2011-01-21 ENCOUNTER — Other Ambulatory Visit (INDEPENDENT_AMBULATORY_CARE_PROVIDER_SITE_OTHER): Payer: 59

## 2011-01-21 ENCOUNTER — Ambulatory Visit (INDEPENDENT_AMBULATORY_CARE_PROVIDER_SITE_OTHER)
Admission: RE | Admit: 2011-01-21 | Discharge: 2011-01-21 | Disposition: A | Discharge status: Home / Self Care | Payer: 59 | Source: Ambulatory Visit | Attending: Cardiology | Admitting: Cardiology

## 2011-01-21 DIAGNOSIS — R079 Chest pain, unspecified: Secondary | ICD-10-CM

## 2011-01-21 DIAGNOSIS — R9430 Abnormal result of cardiovascular function study, unspecified: Secondary | ICD-10-CM

## 2011-01-21 DIAGNOSIS — Z01810 Encounter for preprocedural cardiovascular examination: Secondary | ICD-10-CM

## 2011-01-21 DIAGNOSIS — I10 Essential (primary) hypertension: Secondary | ICD-10-CM

## 2011-01-21 DIAGNOSIS — E785 Hyperlipidemia, unspecified: Secondary | ICD-10-CM

## 2011-01-21 LAB — CBC WITH DIFFERENTIAL/PLATELET
Eosinophils Absolute: 0.1 10*3/uL (ref 0.0–0.7)
HCT: 43.7 % (ref 39.0–52.0)
Lymphocytes Relative: 23.5 % (ref 12.0–46.0)
Lymphs Abs: 2.3 10*3/uL (ref 0.7–4.0)
Monocytes Absolute: 0.6 10*3/uL (ref 0.1–1.0)
RDW: 16.1 % — ABNORMAL HIGH (ref 11.5–14.6)

## 2011-01-21 LAB — PROTIME-INR
INR: 1 ratio (ref 0.8–1.0)
Prothrombin Time: 11 s (ref 10.2–12.4)

## 2011-01-21 LAB — BASIC METABOLIC PANEL
BUN: 12 mg/dL (ref 6–23)
CO2: 27 mEq/L (ref 19–32)
GFR: 95.38 mL/min (ref >60.00)
Glucose, Bld: 98 mg/dL (ref 70–99)
Potassium: 3.8 mEq/L (ref 3.5–5.1)
Sodium: 137 mEq/L (ref 135–145)

## 2011-01-27 ENCOUNTER — Encounter: Payer: Self-pay | Admitting: *Deleted

## 2011-01-30 ENCOUNTER — Inpatient Hospital Stay (HOSPITAL_BASED_OUTPATIENT_CLINIC_OR_DEPARTMENT_OTHER)
Admission: RE | Admit: 2011-01-30 | Discharge: 2011-01-30 | Disposition: A | Discharge status: Home / Self Care | Payer: 59 | Source: Ambulatory Visit | Attending: Cardiology | Admitting: Cardiology

## 2011-01-30 DIAGNOSIS — R079 Chest pain, unspecified: Secondary | ICD-10-CM | POA: Insufficient documentation

## 2011-01-30 DIAGNOSIS — R9439 Abnormal result of other cardiovascular function study: Secondary | ICD-10-CM | POA: Insufficient documentation

## 2011-02-05 ENCOUNTER — Encounter (INDEPENDENT_AMBULATORY_CARE_PROVIDER_SITE_OTHER): Payer: Self-pay | Admitting: Surgery

## 2011-02-05 ENCOUNTER — Telehealth: Payer: Self-pay | Admitting: Cardiology

## 2011-02-05 NOTE — Telephone Encounter (Signed)
Spoke with pt, he thinks the swelling he has in his groin is from the way he was sitting on the drive to [place]. He wanted to make sure ok to take advil for the pain. Okay given Deliah Goody

## 2011-02-05 NOTE — Telephone Encounter (Signed)
Pt calling c/o swelling in the groin area following driving down to [facility]. Pt would like to know if he needs to take any Advil and/or tylenol. Please return pt call to discuss further.

## 2011-02-07 NOTE — Cardiovascular Report (Signed)
  Alejandro Middleton, Alejandro Middleton                ACCOUNT NO.:  [id number]  MEDICAL RECORD NO.:  [id number]  LOCATION:                                 FACILITY:  PHYSICIAN:  Marca Ancona, MD      DATE OF BIRTH:  Dec 29, 1960  DATE OF PROCEDURE:  01/30/2011 DATE OF DISCHARGE:                           CARDIAC CATHETERIZATION   PROCEDURES: 1. Left heart catheterization. 2. Coronary angiography. 3. Left ventriculography.  INDICATIONS:  This is a 50 year old with a history of chest pain episodes who had a stress echo that was concerning for possible ischemia.  PROCEDURE NOTE:  After informed consent was obtained, the right groin was sterilely prepped and draped.  Lidocaine 1% was used to locally anesthetize the right groin area.  The right common femoral artery was entered using modified Seldinger technique and a 4-French arterial sheath was placed.  The left coronary artery was engaged using a JL-4 catheter.  The right coronary artery was engaged using the [facility] catheter and left ventricle was entered using angled pigtail catheter.  There were no complications.  FINDINGS: 1. Hemodynamics:  LV 152/22, aorta 153/108. 2. Left ventriculography:  EF was estimated to be 65-70%.  There were     no wall motion abnormalities in the RAO projection. 3. Coronary arteries:  The coronary artery system was right dominant.     There was no angiographic coronary artery disease.  No other     coronary anomalies.  IMPRESSION:  No angiographic coronary artery disease.  The patient's blood pressure is quite elevated during this case.  He needs additional blood pressure control.  He does state he took his losartan this morning and I am going to add amlodipine 5 mg daily to his regimen.     Marca Ancona, MD     DM/MEDQ  D:  01/30/2011  T:  01/30/2011  Job:  [id number]  cc:   Madolyn Frieze. Jens Som, MD, [facility] Corwin Levins, MD  Electronically Signed by Marca Ancona MD on 02/07/2011 11:47:42 PM

## 2011-02-09 ENCOUNTER — Ambulatory Visit (INDEPENDENT_AMBULATORY_CARE_PROVIDER_SITE_OTHER): Payer: 59 | Admitting: Surgery

## 2011-02-13 ENCOUNTER — Encounter: Payer: 59 | Admitting: Physician Assistant

## 2011-02-24 ENCOUNTER — Encounter: Payer: Self-pay | Admitting: Cardiology

## 2011-02-24 ENCOUNTER — Ambulatory Visit (INDEPENDENT_AMBULATORY_CARE_PROVIDER_SITE_OTHER): Payer: 59 | Admitting: Cardiology

## 2011-02-24 DIAGNOSIS — R079 Chest pain, unspecified: Secondary | ICD-10-CM

## 2011-02-24 DIAGNOSIS — E785 Hyperlipidemia, unspecified: Secondary | ICD-10-CM

## 2011-02-24 DIAGNOSIS — I10 Essential (primary) hypertension: Secondary | ICD-10-CM

## 2011-02-24 NOTE — Progress Notes (Signed)
HPI: Pleasant male I initially saw in August of 2012 with atypical chest pain. Stress echo in August of 2012 revealed ECG abnormalities and inferobasal hpokinesis. We therefore scheduled a cath which was performed in Sept 2012 and showed normal coronary arteries and normal LV function. Since he was last seen, he denies dyspnea on exertion, orthopnea, PND, pedal edema, chest pain or syncope.   Current Outpatient Prescriptions  Medication Sig Dispense Refill  . amLODipine (NORVASC) 5 MG tablet Take 5 mg by mouth daily.       Marland Kitchen aspirin 81 MG EC tablet Take 81 mg by mouth daily.        Marland Kitchen atorvastatin (LIPITOR) 40 MG tablet Take 1 tablet (40 mg total) by mouth daily.  90 tablet  3  . VIAGRA 100 MG tablet          Past Medical History  Diagnosis Date  . Hyperlipidemia   . Hypertension   . Erectile dysfunction   . OSA (obstructive sleep apnea)     s/p surgery    Past Surgical History  Procedure Date  . S/p deviated nasal septum 2001  . S/p uvulopaltoplasty 2001  . Vasectomy     Dr. Brunilda Payor    History   Social History  . Marital Status: Single    Spouse Name: N/A    Number of Children: 1  . Years of Education: N/A   Occupational History  . 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 Smoking status: Former Games developer  . Smokeless tobacco: Not on file   Comment: high school only  . Alcohol Use: Yes     rare  . Drug Use: No  . Sexually Active: Not on file   Other Topics Concern  . Not on file   Social History Narrative   Divorced/girlfriend    ROS: no fevers or chills, productive cough, hemoptysis, dysphasia, odynophagia, melena, hematochezia, dysuria, hematuria, rash, seizure activity, orthopnea, PND, pedal edema, claudication. Remaining systems are negative.  Physical Exam: Well-developed well-nourished in no acute distress.  Skin is warm and dry.  HEENT is normal.  Neck is  supple. No thyromegaly.  Chest is clear to auscultation with normal expansion.  Cardiovascular exam is regular rate and rhythm.  Abdominal exam nontender or distended. No masses palpated. Right groin with no hematoma and no bruit. Extremities show no edema. neuro grossly intact

## 2011-02-24 NOTE — Assessment & Plan Note (Signed)
No further symptoms. Catheterization revealed no coronary disease. No further cardiac evaluation.

## 2011-02-24 NOTE — Assessment & Plan Note (Signed)
Blood pressure controlled. Continue present medications. 

## 2011-02-24 NOTE — Assessment & Plan Note (Signed)
Continue statin. Lipids and liver monitored by primary care. 

## 2011-05-06 ENCOUNTER — Telehealth: Payer: Self-pay | Admitting: Cardiology

## 2011-05-06 ENCOUNTER — Encounter: Payer: Self-pay | Admitting: *Deleted

## 2011-05-06 NOTE — Telephone Encounter (Signed)
Spoke with pt, letter generated and mailed to pt home address

## 2011-05-06 NOTE — Telephone Encounter (Signed)
New message:  Pt needs a note for work stating he is released for all restrictions.  Please call and advise when this is ready for him to pick up.

## 2011-07-13 ENCOUNTER — Ambulatory Visit: Payer: 59 | Admitting: Internal Medicine

## 2011-07-21 ENCOUNTER — Other Ambulatory Visit (INDEPENDENT_AMBULATORY_CARE_PROVIDER_SITE_OTHER): Payer: 59

## 2011-07-21 ENCOUNTER — Ambulatory Visit (INDEPENDENT_AMBULATORY_CARE_PROVIDER_SITE_OTHER): Payer: 59 | Admitting: Internal Medicine

## 2011-07-21 ENCOUNTER — Encounter: Payer: Self-pay | Admitting: Internal Medicine

## 2011-07-21 VITALS — BP 112/82 | HR 61 | Temp 98.5°F | Ht 73.5 in | Wt 251.1 lb

## 2011-07-21 DIAGNOSIS — Z0000 Encounter for general adult medical examination without abnormal findings: Secondary | ICD-10-CM

## 2011-07-21 DIAGNOSIS — E785 Hyperlipidemia, unspecified: Secondary | ICD-10-CM

## 2011-07-21 LAB — LIPID PANEL
HDL: 46.9 mg/dL (ref >39.00)
Total CHOL/HDL Ratio: 6
VLDL: 22.8 mg/dL (ref 0.0–40.0)

## 2011-07-21 LAB — CBC WITH DIFFERENTIAL/PLATELET
Basophils Absolute: 0 10*3/uL (ref 0.0–0.1)
Eosinophils Absolute: 0.1 10*3/uL (ref 0.0–0.7)
Lymphocytes Relative: 29.4 % (ref 12.0–46.0)
MCHC: 32.7 g/dL (ref 30.0–36.0)
MCV: 80.3 fl (ref 78.0–100.0)
Monocytes Absolute: 0.5 10*3/uL (ref 0.1–1.0)
Neutro Abs: 5.6 10*3/uL (ref 1.4–7.7)
Neutrophils Relative %: 62.7 % (ref 43.0–77.0)
RDW: 15 % — ABNORMAL HIGH (ref 11.5–14.6)

## 2011-07-21 LAB — BASIC METABOLIC PANEL
BUN: 13 mg/dL (ref 6–23)
Chloride: 103 mEq/L (ref 96–112)
GFR: 92.15 mL/min (ref >60.00)
Potassium: 4.3 mEq/L (ref 3.5–5.1)
Sodium: 139 mEq/L (ref 135–145)

## 2011-07-21 LAB — HEPATIC FUNCTION PANEL
ALT: 19 U/L (ref 0–53)
AST: 15 U/L (ref 0–37)
Bilirubin, Direct: 0.1 mg/dL (ref 0.0–0.3)
Total Bilirubin: 0.9 mg/dL (ref 0.3–1.2)

## 2011-07-21 LAB — URINALYSIS, ROUTINE W REFLEX MICROSCOPIC
Bilirubin Urine: NEGATIVE
Hgb urine dipstick: NEGATIVE
Urine Glucose: NEGATIVE
Urobilinogen, UA: 0.2 (ref 0.0–1.0)

## 2011-07-21 MED ORDER — ROSUVASTATIN CALCIUM 20 MG PO TABS: 20.0000 mg | ORAL_TABLET | Freq: Every day | ORAL | Status: DC

## 2011-07-21 NOTE — Patient Instructions (Signed)
OK to stop the lipitor Start the crestor 40 mg per day Continue all other medications as before Please have the pharmacy call if you need other medication refills Please go to LAB in the Basement for the blood and/or urine tests to be done today Please call the phone number [phone] (the PhoneTree System) for results of testing in 2-3 days;  When calling, simply dial the number, and when prompted enter the MRN number above (the Medical Record Number) and the # key, then the message should start. Please return in 1 year for your yearly visit, or sooner if needed, with Lab testing done 3-5 days before

## 2011-07-21 NOTE — Progress Notes (Signed)
Subjective:    Patient ID: Alejandro Middleton, male    DOB: [birth year], 51 y.o.   MRN: [id number]  HPI  Here for wellness and f/u;  Overall doing ok;  Pt denies CP, worsening SOB, DOE, wheezing, orthopnea, PND, worsening LE edema, palpitations, dizziness or syncope.  Pt denies neurological change such as new Headache, facial or extremity weakness.  Pt denies polydipsia, polyuria, or low sugar symptoms. Pt states overall good compliance with treatment and medications, good tolerability, and trying to follow lower cholesterol diet.  Pt denies worsening depressive symptoms, suicidal ideation or panic. No fever, wt loss, night sweats, loss of appetite, or other constitutional symptoms.  Pt states good ability with ADL's, low fall risk, home safety reviewed and adequate, no significant changes in hearing or vision, and occasionally active with exercise.  No other acute complaints Past Medical History  Diagnosis Date  . Hyperlipidemia   . Hypertension   . Erectile dysfunction   . OSA (obstructive sleep apnea)     s/p surgery   Past Surgical History  Procedure Date  . S/p deviated nasal septum 2001  . S/p uvulopaltoplasty 2001  . Vasectomy     Dr. Brunilda Payor    reports that he has quit smoking. He does not have any smokeless tobacco history on file. He reports that he drinks alcohol. He reports that he does not use illicit drugs. family history includes Cancer in his sister; Glaucoma in his mother; Heart attack (age of onset:49) in his father; Hypertension in his mother; and Stroke (age of onset:90) in his mother. No Known Allergies Current Outpatient Prescriptions on File Prior to Visit  Medication Sig Dispense Refill  . amLODipine (NORVASC) 5 MG tablet Take 5 mg by mouth daily.       Marland Kitchen aspirin 81 MG EC tablet Take 81 mg by mouth daily.        Marland Kitchen VIAGRA 100 MG tablet        Review of Systems Review of Systems  Constitutional: Negative for diaphoresis, activity change, appetite change and unexpected  weight change.  HENT: Negative for hearing loss, ear pain, facial swelling, mouth sores and neck stiffness.   Eyes: Negative for pain, redness and visual disturbance.  Respiratory: Negative for shortness of breath and wheezing.   Cardiovascular: Negative for chest pain and palpitations.  Gastrointestinal: Negative for diarrhea, blood in stool, abdominal distention and rectal pain.  Genitourinary: Negative for hematuria, flank pain and decreased urine volume.  Musculoskeletal: Negative for myalgias and joint swelling.  Skin: Negative for color change and wound.  Neurological: Negative for syncope and numbness.  Hematological: Negative for adenopathy.  Psychiatric/Behavioral: Negative for hallucinations, self-injury, decreased concentration and agitation.      Objective:   Physical Exam BP 112/82  Pulse 61  Temp(Src) 98.5 F (36.9 C) (Oral)  Ht 6' 1.5" (1.867 m)  Wt 251 lb 2 oz (113.91 kg)  BMI 32.68 kg/m2  SpO2 96% Physical Exam  VS noted Constitutional: Pt is oriented to person, place, and time. Appears well-developed and well-nourished.  HENT:  Head: Normocephalic and atraumatic.  Right Ear: External ear normal.  Left Ear: External ear normal.  Nose: Nose normal.  Mouth/Throat: Oropharynx is clear and moist.  Eyes: Conjunctivae and EOM are normal. Pupils are equal, round, and reactive to light.  Neck: Normal range of motion. Neck supple. No JVD present. No tracheal deviation present.  Cardiovascular: Normal rate, regular rhythm, normal heart sounds and intact distal pulses.   Pulmonary/Chest: Effort normal  and breath sounds normal.  Abdominal: Soft. Bowel sounds are normal. There is no tenderness.  Musculoskeletal: Normal range of motion. Exhibits no edema.  Lymphadenopathy:  Has no cervical adenopathy.  Neurological: Pt is alert and oriented to person, place, and time. Pt has normal reflexes. No cranial nerve deficit.  Skin: Skin is warm and dry. No rash noted.    Psychiatric:  Has  normal mood and affect. Behavior is normal.     Assessment & Plan:

## 2011-07-24 ENCOUNTER — Telehealth: Payer: Self-pay | Admitting: Internal Medicine

## 2011-07-24 NOTE — Telephone Encounter (Signed)
The pt requested samples of Crestor 20mg  due to the med being 75.00 in the pharmacy.  The pt was given one box (42 pills).

## 2011-07-26 ENCOUNTER — Encounter: Payer: Self-pay | Admitting: Internal Medicine

## 2011-07-26 NOTE — Assessment & Plan Note (Signed)

## 2011-07-26 NOTE — Assessment & Plan Note (Addendum)
To increse the crestor to 40 mg, f/u lab next visit, goal ldl < 70  Lab Results  Component Value Date   CHOL 292* 07/21/2011   HDL 46.90 07/21/2011   LDLDIRECT 235.1 07/21/2011   TRIG 114.0 07/21/2011   CHOLHDL 6 07/21/2011

## 2011-11-09 ENCOUNTER — Ambulatory Visit (INDEPENDENT_AMBULATORY_CARE_PROVIDER_SITE_OTHER): Payer: 59 | Admitting: Emergency Medicine

## 2011-11-09 VITALS — BP 154/80 | HR 69 | Temp 98.1°F | Resp 20 | Ht 72.5 in | Wt 250.8 lb

## 2011-11-09 DIAGNOSIS — R300 Dysuria: Secondary | ICD-10-CM

## 2011-11-09 DIAGNOSIS — N453 Epididymo-orchitis: Secondary | ICD-10-CM

## 2011-11-09 DIAGNOSIS — N451 Epididymitis: Secondary | ICD-10-CM

## 2011-11-09 LAB — POCT UA - MICROSCOPIC ONLY
Crystals, Ur, HPF, POC: NEGATIVE
Epithelial cells, urine per micros: NEGATIVE
Mucus, UA: POSITIVE

## 2011-11-09 LAB — POCT URINALYSIS DIPSTICK
Bilirubin, UA: NEGATIVE
Blood, UA: NEGATIVE
Leukocytes, UA: NEGATIVE
Protein, UA: NEGATIVE
Spec Grav, UA: >=1.03
pH, UA: 5.5

## 2011-11-09 MED ORDER — CIPROFLOXACIN HCL 500 MG PO TABS: 500.0000 mg | ORAL_TABLET | Freq: Two times a day (BID) | ORAL | Status: AC

## 2011-11-09 NOTE — Progress Notes (Signed)
Subjective:    Patient ID: [name], male    DOB: [birth year], 51 y.o.   MRN: [id number]  Dysuria  This is a new problem. The current episode started in the past 7 days. The problem occurs every urination. The problem has been unchanged. The quality of the pain is described as burning. The pain is mild. There has been no fever. He is sexually active. There is no history of pyelonephritis. Pertinent negatives include no chills, discharge, flank pain, frequency, hematuria, hesitancy, nausea, possible pregnancy, sweats or vomiting. He has tried nothing for the symptoms. There is no history of catheterization, kidney stones, recurrent UTIs, a single kidney, urinary stasis or a urological procedure.      Review of Systems  Constitutional: Negative.  Negative for chills.  HENT: Negative.   Eyes: Negative.   Cardiovascular: Negative.   Gastrointestinal: Negative.  Negative for nausea and vomiting.  Genitourinary: Positive for dysuria and testicular pain. Negative for hesitancy, frequency, hematuria, flank pain, discharge, scrotal swelling, genital sores and penile pain.  Musculoskeletal: Negative.   Neurological: Negative.        Objective:   Physical Exam  Constitutional: He is oriented to person, place, and time. He appears well-developed and well-nourished.  HENT:  Head: Normocephalic and atraumatic.  Right Ear: External ear normal.  Left Ear: External ear normal.  Eyes: Conjunctivae are normal. Pupils are equal, round, and reactive to light.  Neck: Normal range of motion. Neck supple.  Cardiovascular: Normal rate and regular rhythm.   Pulmonary/Chest: Effort normal and breath sounds normal.  Abdominal: Soft. Hernia confirmed negative in the right inguinal area and confirmed negative in the left inguinal area.  Genitourinary: Penis normal. Right testis shows tenderness. Right testis shows no mass and no swelling. Right testis is descended. Cremasteric reflex is not absent on the  right side. Left testis shows no mass, no swelling and no tenderness. Left testis is descended. Cremasteric reflex is not absent on the left side. Circumcised. No penile erythema. No discharge found.  Musculoskeletal: Normal range of motion.  Neurological: He is alert and oriented to person, place, and time.  Skin: Skin is warm and dry.          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  Denies new sexual encounter or discharge.  Has pain and tenderness in right testicle with no history of injury  Results for orders placed in visit on 11/09/11  POCT UA - MICROSCOPIC ONLY      Component Value Range   WBC, Ur, HPF, POC 0-1     RBC, urine, microscopic 2-3     Bacteria, U Microscopic 1+     Mucus, UA pos     Epithelial cells, urine per micros neg     Crystals, Ur, HPF, POC neg     Casts, Ur, LPF, POC neg     Yeast, UA neg    POCT URINALYSIS DIPSTICK      Component Value Range   Color, UA yellow     Clarity, UA clear     Glucose, UA neg     Bilirubin, UA neg     Ketones, UA trace     Spec Grav, UA >=1.030     Blood, UA neg     pH, UA 5.5     Protein, UA neg     Urobilinogen, UA 1.0     Nitrite, UA neg  Leukocytes, UA Negative

## 2011-11-14 ENCOUNTER — Telehealth: Payer: Self-pay

## 2011-11-14 NOTE — Telephone Encounter (Signed)
Patient states Rx is not strong enough and is not working. Pt would like something else that is stronger.

## 2011-11-15 NOTE — Telephone Encounter (Signed)
Pt has swelling and pain testicular area not any better with this since Monday taking Cipro wants to know if needs something else? using [facility] [address]

## 2011-11-15 NOTE — Telephone Encounter (Signed)
Pt called again 6/23. Antibiotics not working, does not feel better at all

## 2011-11-16 NOTE — Telephone Encounter (Signed)
Needs to return to clinic 

## 2011-11-16 NOTE — Telephone Encounter (Signed)
LMOM for pt that he needs to RTC for re-eval if there has been no improvement, and asked for CB w/any further ?s.

## 2011-11-20 ENCOUNTER — Ambulatory Visit (INDEPENDENT_AMBULATORY_CARE_PROVIDER_SITE_OTHER): Payer: 59 | Admitting: Surgery

## 2011-11-20 DIAGNOSIS — R69 Illness, unspecified: Secondary | ICD-10-CM

## 2011-11-21 NOTE — Progress Notes (Signed)
Patient ID: Alejandro Middleton, male   DOB: 04-May-1961, 51 y.o.   MRN: [id number] Pt rescheduled

## 2011-12-25 ENCOUNTER — Encounter (INDEPENDENT_AMBULATORY_CARE_PROVIDER_SITE_OTHER): Payer: 59 | Admitting: Surgery

## 2011-12-29 ENCOUNTER — Encounter (INDEPENDENT_AMBULATORY_CARE_PROVIDER_SITE_OTHER): Payer: 59 | Admitting: Surgery

## 2012-01-14 ENCOUNTER — Ambulatory Visit (INDEPENDENT_AMBULATORY_CARE_PROVIDER_SITE_OTHER): Payer: 59 | Admitting: Internal Medicine

## 2012-01-14 ENCOUNTER — Other Ambulatory Visit (INDEPENDENT_AMBULATORY_CARE_PROVIDER_SITE_OTHER): Payer: 59

## 2012-01-14 ENCOUNTER — Encounter: Payer: Self-pay | Admitting: Internal Medicine

## 2012-01-14 VITALS — BP 138/100 | HR 67 | Temp 97.3°F | Ht 73.5 in | Wt 246.5 lb

## 2012-01-14 DIAGNOSIS — R310 Gross hematuria: Secondary | ICD-10-CM | POA: Insufficient documentation

## 2012-01-14 DIAGNOSIS — Z87442 Personal history of urinary calculi: Secondary | ICD-10-CM

## 2012-01-14 DIAGNOSIS — Z0000 Encounter for general adult medical examination without abnormal findings: Secondary | ICD-10-CM

## 2012-01-14 DIAGNOSIS — R1031 Right lower quadrant pain: Secondary | ICD-10-CM | POA: Insufficient documentation

## 2012-01-14 DIAGNOSIS — Z23 Encounter for immunization: Secondary | ICD-10-CM

## 2012-01-14 HISTORY — DX: Personal history of urinary calculi: Z87.442

## 2012-01-14 LAB — PSA: PSA: 0.89 ng/mL (ref 0.10–4.00)

## 2012-01-14 LAB — CBC WITH DIFFERENTIAL/PLATELET
Basophils Relative: 0.4 % (ref 0.0–3.0)
Eosinophils Absolute: 0.1 10*3/uL (ref 0.0–0.7)
Eosinophils Relative: 0.9 % (ref 0.0–5.0)
HCT: 42.3 % (ref 39.0–52.0)
Lymphs Abs: 2.2 10*3/uL (ref 0.7–4.0)
MCHC: 32.7 g/dL (ref 30.0–36.0)
MCV: 79.6 fl (ref 78.0–100.0)
Monocytes Absolute: 0.6 10*3/uL (ref 0.1–1.0)
Platelets: 241 10*3/uL (ref 150.0–400.0)
WBC: 10.1 10*3/uL (ref 4.5–10.5)

## 2012-01-14 LAB — URINALYSIS, ROUTINE W REFLEX MICROSCOPIC
Leukocytes, UA: NEGATIVE
Nitrite: NEGATIVE
Specific Gravity, Urine: >=1.03 (ref 1.000–1.030)
Total Protein, Urine: NEGATIVE
pH: 6 (ref 5.0–8.0)

## 2012-01-14 LAB — BASIC METABOLIC PANEL
Chloride: 105 mEq/L (ref 96–112)
GFR: 97.15 mL/min (ref >60.00)
Potassium: 3.9 mEq/L (ref 3.5–5.1)
Sodium: 138 mEq/L (ref 135–145)

## 2012-01-14 LAB — LIPID PANEL
Cholesterol: 289 mg/dL — ABNORMAL HIGH (ref 0–200)
HDL: 54.5 mg/dL (ref >39.00)
VLDL: 21.2 mg/dL (ref 0.0–40.0)

## 2012-01-14 LAB — HEPATIC FUNCTION PANEL
ALT: 17 U/L (ref 0–53)
Total Bilirubin: 1.3 mg/dL — ABNORMAL HIGH (ref 0.3–1.2)
Total Protein: 7 g/dL (ref 6.0–8.3)

## 2012-01-14 MED ORDER — ROSUVASTATIN CALCIUM 40 MG PO TABS: 40.0000 mg | ORAL_TABLET | Freq: Every day | ORAL | Status: DC

## 2012-01-14 NOTE — Assessment & Plan Note (Signed)
Hx c/w intermittent pain and assoc hematuria such as with stone stuck at the right UPJ junction, has hx of stone - for CT to r/o stone, doubt other such as appendix

## 2012-01-14 NOTE — Assessment & Plan Note (Signed)

## 2012-01-14 NOTE — Progress Notes (Signed)
Subjective:    Patient ID: Alejandro Middleton, male    DOB: 04/07/1961, 51 y.o.   MRN: [id number]  HPI  Here for wellness and f/u;  Overall doing ok;  Pt denies CP, worsening SOB, DOE, wheezing, orthopnea, PND, worsening LE edema, palpitations, dizziness or syncope.  Pt denies neurological change such as new Headache, facial or extremity weakness.  Pt denies polydipsia, polyuria, or low sugar symptoms. Pt states overall good compliance with treatment and medications, good tolerability, and trying to follow lower cholesterol diet.  Pt denies worsening depressive symptoms, suicidal ideation or panic. No fever, wt loss, night sweats, loss of appetite, or other constitutional symptoms.  Pt states good ability with ADL's, low fall risk, home safety reviewed and adequate, no significant changes in hearing or vision, and occasionally active with exercise.  Has had mild intermittent episodes BRB in urine with recurrent RLQ pain since mid July, not better with tx per urgent care as documented June 2013 at urgent care.  + hx or renal stone about 12 yrs ago.  Has appt in f/u with Dr Nesi/urology approx sept 10 Past Medical History  Diagnosis Date  . Hyperlipidemia   . Hypertension   . Erectile dysfunction   . OSA (obstructive sleep apnea)     s/p surgery   Past Surgical History  Procedure Date  . S/p deviated nasal septum 2001  . S/p uvulopaltoplasty 2001  . Vasectomy     Dr. Brunilda Payor    reports that he quit smoking about 35 years ago. He does not have any smokeless tobacco history on file. He reports that he drinks alcohol. He reports that he does not use illicit drugs. family history includes Cancer in his sister; Glaucoma in his mother; Heart attack (age of onset:49) in his father; Hypertension in his mother; and Stroke (age of onset:90) in his mother. No Known Allergies Current Outpatient Prescriptions on File Prior to Visit  Medication Sig Dispense Refill  . aspirin 81 MG EC tablet Take 81 mg by mouth  daily.        Marland Kitchen VIAGRA 100 MG tablet       . amLODipine (NORVASC) 5 MG tablet Take 5 mg by mouth daily.        Review of Systems Review of Systems  Constitutional: Negative for diaphoresis, activity change, appetite change and unexpected weight change.  HENT: Negative for hearing loss, ear pain, facial swelling, mouth sores and neck stiffness.   Eyes: Negative for pain, redness and visual disturbance.  Respiratory: Negative for shortness of breath and wheezing.   Cardiovascular: Negative for chest pain and palpitations.  Gastrointestinal: Negative for diarrhea, blood in stool, abdominal distention and rectal pain.  Genitourinary: Negative for hematuria, flank pain and decreased urine volume.  Musculoskeletal: Negative for myalgias and joint swelling.  Skin: Negative for color change and wound.  Neurological: Negative for syncope and numbness.  Hematological: Negative for adenopathy.  Psychiatric/Behavioral: Negative for hallucinations, self-injury, decreased concentration and agitation.      Objective:   Physical Exam BP 138/100  Pulse 67  Temp 97.3 F (36.3 C) (Oral)  Ht 6' 1.5" (1.867 m)  Wt 246 lb 8 oz (111.812 kg)  BMI 32.08 kg/m2  SpO2 98% Physical Exam  VS noted Constitutional: Pt is oriented to person, place, and time. Appears well-developed and well-nourished.  Head: Normocephalic and atraumatic.  Right Ear: External ear normal.  Left Ear: External ear normal.  Nose: Nose normal.  Mouth/Throat: Oropharynx is clear and moist.  Eyes: Conjunctivae and EOM are normal. Pupils are equal, round, and reactive to light.  Neck: Normal range of motion. Neck supple. No JVD present. No tracheal deviation present.  Cardiovascular: Normal rate, regular rhythm, normal heart sounds and intact distal pulses.   Pulmonary/Chest: Effort normal and breath sounds normal.  Abdominal: Soft. Bowel sounds are normal. There is no tenderness.  GU: normal testicle size, non  swollen Musculoskeletal: Normal range of motion. Exhibits no edema.  Lymphadenopathy:  Has no cervical adenopathy.  Neurological: Pt is alert and oriented to person, place, and time. Pt has normal reflexes. No cranial nerve deficit.  Skin: Skin is warm and dry. No rash noted.  Psychiatric:  Has  normal mood and affect. Behavior is normal.     Assessment & Plan:

## 2012-01-14 NOTE — Patient Instructions (Addendum)
You will be contacted regarding the referral for: CT abdomen to see about kidney stone Please keep your appointments with your specialists as you have planned  - Dr Nesi/urology Continue all other medications as before, including the crestor at 40 mg per day Your refills were done as requested, and the crestor discount card given Please return in 6 months, or sooner if needed

## 2012-01-14 NOTE — Assessment & Plan Note (Signed)
Cant r/o malignacy, will likely need cysto, to f/u with urology as well

## 2012-01-15 ENCOUNTER — Ambulatory Visit (INDEPENDENT_AMBULATORY_CARE_PROVIDER_SITE_OTHER)
Admission: RE | Admit: 2012-01-15 | Discharge: 2012-01-15 | Disposition: A | Discharge status: Home / Self Care | Payer: 59 | Source: Ambulatory Visit | Attending: Internal Medicine | Admitting: Internal Medicine

## 2012-01-15 ENCOUNTER — Encounter: Payer: Self-pay | Admitting: Internal Medicine

## 2012-01-15 DIAGNOSIS — R1031 Right lower quadrant pain: Secondary | ICD-10-CM

## 2012-01-15 DIAGNOSIS — R310 Gross hematuria: Secondary | ICD-10-CM

## 2012-01-28 ENCOUNTER — Telehealth: Payer: Self-pay | Admitting: Cardiology

## 2012-01-28 ENCOUNTER — Telehealth: Payer: Self-pay | Admitting: Internal Medicine

## 2012-01-28 ENCOUNTER — Other Ambulatory Visit: Payer: Self-pay | Admitting: Cardiology

## 2012-01-28 NOTE — Telephone Encounter (Signed)
error 

## 2012-01-28 NOTE — Telephone Encounter (Signed)
Alejandro Middleton called back.  The doctor who wrote his BP medicine is Dr. Marca Ancona.  I transferred him to Cardiology.

## 2012-01-28 NOTE — Telephone Encounter (Signed)
Alejandro Middleton made his appt for Feb.  While on the phone he told me he hasn't taken his BP medication since July because the RX ran out.  He said it is the one on his med sheet (Norvasc).  Please call it in.

## 2012-02-24 ENCOUNTER — Other Ambulatory Visit: Payer: Self-pay | Admitting: Cardiology

## 2012-02-25 ENCOUNTER — Other Ambulatory Visit: Payer: Self-pay | Admitting: Cardiology

## 2012-04-28 ENCOUNTER — Encounter: Payer: Self-pay | Admitting: Internal Medicine

## 2012-04-28 ENCOUNTER — Ambulatory Visit (INDEPENDENT_AMBULATORY_CARE_PROVIDER_SITE_OTHER): Payer: 59 | Admitting: Internal Medicine

## 2012-04-28 VITALS — BP 122/80 | HR 70 | Temp 97.9°F | Ht 74.5 in | Wt 250.2 lb

## 2012-04-28 DIAGNOSIS — E669 Obesity, unspecified: Secondary | ICD-10-CM

## 2012-04-28 DIAGNOSIS — G4733 Obstructive sleep apnea (adult) (pediatric): Secondary | ICD-10-CM

## 2012-04-28 DIAGNOSIS — I10 Essential (primary) hypertension: Secondary | ICD-10-CM

## 2012-04-28 DIAGNOSIS — E785 Hyperlipidemia, unspecified: Secondary | ICD-10-CM

## 2012-04-28 MED ORDER — PHENTERMINE HCL 37.5 MG PO CAPS: 37.5000 mg | ORAL_CAPSULE | ORAL | Status: DC

## 2012-04-28 NOTE — Assessment & Plan Note (Signed)
stable overall by hx and exam, most recent data reviewed with pt, and pt to continue medical treatment as before BP Readings from Last 3 Encounters:  04/28/12 122/80  01/14/12 138/100  11/09/11 154/80

## 2012-04-28 NOTE — Assessment & Plan Note (Signed)
Ok for phentermine asd, limit 3 mo

## 2012-04-28 NOTE — Assessment & Plan Note (Signed)
Not using CPAP, denies significant daytime somnolence, declines referral for f/u

## 2012-04-28 NOTE — Patient Instructions (Addendum)
Take all new medications as prescribed Continue all other medications as before Please have the pharmacy call with any other refills you may need. Please continue your efforts at being more active, low cholesterol diet, and weight control. You are otherwise up to date with prevention measures Your forms were filled out today Please continue your efforts at being more active, low cholesterol diet, and weight control. Thank you for enrolling in MyChart. Please follow the instructions below to securely access your online medical record. MyChart allows you to send messages to your doctor, view your test results, renew your prescriptions, schedule appointments, and more. To Log into MyChart, please go to [email or web address], and your Username is:  [email or web address]

## 2012-04-28 NOTE — Assessment & Plan Note (Signed)
stable overall by hx and exam, most recent data reviewed with pt, and pt to continue medical treatment as before 

## 2012-04-28 NOTE — Progress Notes (Signed)
Subjective:    Patient ID: Alejandro Middleton, male    DOB: 09-13-1960, 51 y.o.   MRN: [id number]  HPI Here to f/u; overall doing ok,  Pt denies chest pain, increased sob or doe, wheezing, orthopnea, PND, increased LE swelling, palpitations, dizziness or syncope.  Pt denies new neurological symptoms such as new headache, or facial or extremity weakness or numbness   Pt denies polydipsia, polyuria, or low sugar symptoms such as weakness or confusion improved with po intake.  Pt states overall good compliance with meds, trying to follow lower cholesterol diet, wt overall stable but little exercise however.  Doesn't use CPAP, Does have sense of ongoing fatigue, but denies signficant hypersomnolence despite hx of OSA.  Does ask for med for wt loss as he has been excerciseing regularly and unable to lose. Past Medical History  Diagnosis Date  . Hyperlipidemia   . Hypertension   . Erectile dysfunction   . OSA (obstructive sleep apnea)     s/p surgery  . History of renal stone 01/14/2012   Past Surgical History  Procedure Date  . S/p deviated nasal septum 2001  . S/p uvulopaltoplasty 2001  . Vasectomy     Dr. Brunilda Payor    reports that he quit smoking about 35 years ago. He does not have any smokeless tobacco history on file. He reports that he drinks alcohol. He reports that he does not use illicit drugs. family history includes Cancer in his sister; Glaucoma in his mother; Heart attack (age of onset:49) in his father; Hypertension in his mother; and Stroke (age of onset:90) in his mother. No Known Allergies Current Outpatient Prescriptions on File Prior to Visit  Medication Sig Dispense Refill  . amLODipine (NORVASC) 5 MG tablet TAKE 1 TABLET BY MOUTH DAILY  90 tablet  0  . aspirin 81 MG EC tablet Take 81 mg by mouth daily.        . rosuvastatin (CRESTOR) 40 MG tablet Take 1 tablet (40 mg total) by mouth daily.  90 tablet  3  . VIAGRA 100 MG tablet       . phentermine 37.5 MG capsule Take 1 capsule  (37.5 mg total) by mouth every morning.  30 capsule  2   Review of Systems  Constitutional: Negative for diaphoresis and unexpected weight change.  HENT: Negative for tinnitus.   Eyes: Negative for photophobia and visual disturbance.  Respiratory: Negative for choking and stridor.   Gastrointestinal: Negative for vomiting and blood in stool.  Genitourinary: Negative for hematuria and decreased urine volume.  Musculoskeletal: Negative for gait problem.  Skin: Negative for color change and wound.  Neurological: Negative for tremors and numbness.  Psychiatric/Behavioral: Negative for decreased concentration. The patient is not hyperactive.       Objective:   Physical Exam BP 122/80  Pulse 70  Temp 97.9 F (36.6 C) (Oral)  Ht 6' 2.5" (1.892 m)  Wt 250 lb 4 oz (113.513 kg)  BMI 31.70 kg/m2  SpO2 96% Physical Exam  VS noted Constitutional: Pt appears well-developed and well-nourished.  HENT: Head: Normocephalic.  Right Ear: External ear normal.  Left Ear: External ear normal.  Eyes: Conjunctivae and EOM are normal. Pupils are equal, round, and reactive to light.  Neck: Normal range of motion. Neck supple.  Cardiovascular: Normal rate and regular rhythm.   Pulmonary/Chest: Effort normal and breath sounds normal.  Abd:  Soft, NT, non-distended, + BS Neurological: Pt is alert. Not confused  Skin: Skin is warm. No erythema.  Psychiatric: Pt behavior is normal. Thought content normal.     Assessment & Plan:

## 2012-06-08 ENCOUNTER — Telehealth: Payer: Self-pay | Admitting: *Deleted

## 2012-06-08 NOTE — Telephone Encounter (Signed)
PATIENT CALLED AND WILL BRING FORM IN FOR [employer] MEDICAL CARD TO BE CORRECTED. STATES IT WAS SENT BACK TO HIM FOR UNCOMPLETED DATES ON IT,. NEEDS ISSUE DATE AND EXPIRATION DATE.  PATIENT CALL BACK #[phone].

## 2012-07-09 ENCOUNTER — Other Ambulatory Visit: Payer: Self-pay

## 2012-07-19 ENCOUNTER — Ambulatory Visit: Payer: 59 | Admitting: Internal Medicine

## 2012-08-13 ENCOUNTER — Other Ambulatory Visit: Payer: Self-pay | Admitting: Cardiology

## 2012-12-17 ENCOUNTER — Other Ambulatory Visit: Payer: Self-pay | Admitting: Cardiology

## 2012-12-31 ENCOUNTER — Other Ambulatory Visit: Payer: Self-pay | Admitting: Internal Medicine

## 2013-01-02 NOTE — Telephone Encounter (Signed)
Faxed hardcopy to [facility] [address] [place]

## 2013-01-02 NOTE — Telephone Encounter (Signed)
Done hardcopy to [facility]  

## 2013-03-01 ENCOUNTER — Ambulatory Visit (INDEPENDENT_AMBULATORY_CARE_PROVIDER_SITE_OTHER): Payer: 59

## 2013-03-01 VITALS — BP 164/98 | HR 58 | Resp 12 | Ht 73.0 in | Wt 250.0 lb

## 2013-03-01 DIAGNOSIS — M722 Plantar fascial fibromatosis: Secondary | ICD-10-CM

## 2013-03-01 NOTE — Patient Instructions (Signed)

## 2013-03-01 NOTE — Progress Notes (Signed)
  Subjective:    Patient ID: Alejandro Middleton, male    DOB: September 20, 1960, 52 y.o.   MRN: [id number]  HPI    Review of Systems  Constitutional: Negative.   HENT: Negative.   Eyes: Negative.   Respiratory: Negative.   Cardiovascular: Negative.   Gastrointestinal: Negative.   Endocrine: Negative.   Genitourinary: Negative.   Allergic/Immunologic: Negative.   Neurological: Negative.   Hematological: Negative.   Psychiatric/Behavioral: Negative.        Objective:   Physical Exam neurovascular status unchanged intact palpable pedal pulses epicritic and proprioceptive sensations intact. Patient had improved inferior left heel pain, there is no increase in pain or symptomology since last being seen. Orthotics fit and contour well to feet.        Assessment & Plan:  Assessment plantar fasciitis/heel spur syndrome left foot. Functional orthotics are dispensed at this time, with break in instructions. Followup in 1-2 months if symptoms continue or persist.  Alvan Dame DPM

## 2013-03-30 ENCOUNTER — Other Ambulatory Visit: Payer: Self-pay

## 2013-05-02 IMAGING — CT CT ABD-PELV W/O CM
2 of 4 series · 17 of 46 positions shown, 19 images · non-contrast
Comparison: Abdominal pelvic CT 07/24/2009.

CLINICAL DATA: Right flank and lower quadrant abdominal pain for 2
months.  Gross hematuria.  History of kidney stones.

CT ABDOMEN AND PELVIS WITHOUT CONTRAST
TECHNIQUE: Multidetector CT imaging of the abdomen and pelvis was
performed following the standard protocol without intravenous
contrast.

[Series 2: ap stone study · axial · 0.79mm/px · z∈[-449,-14]mm · 14 of 95 slices shown, 16 images]
[im 4/95  soft-tissue]
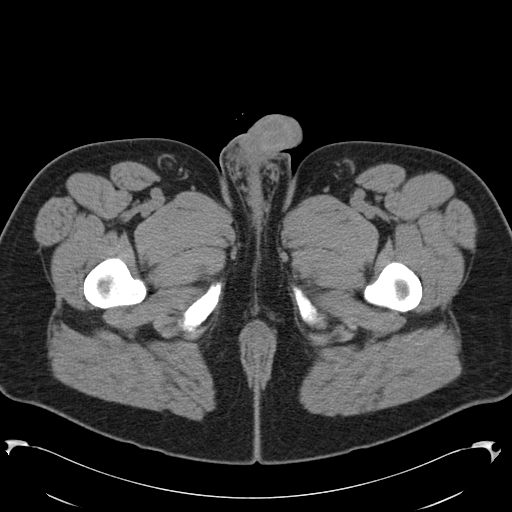
[im 4/95  bone]
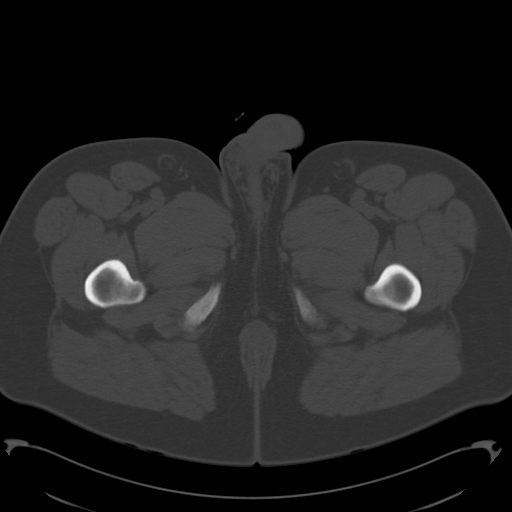
[im 12/95  soft-tissue]
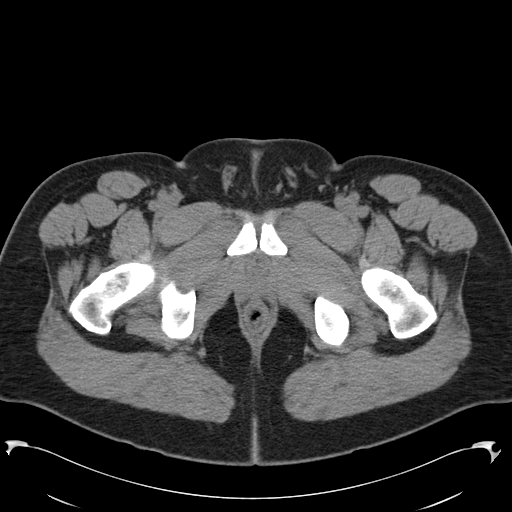
[im 19/95  soft-tissue]
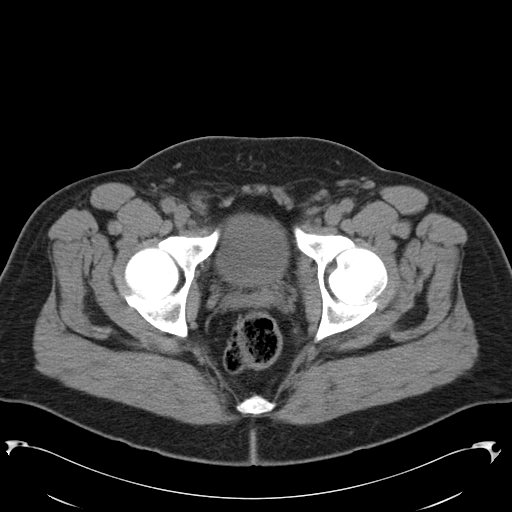
[im 27/95  soft-tissue]
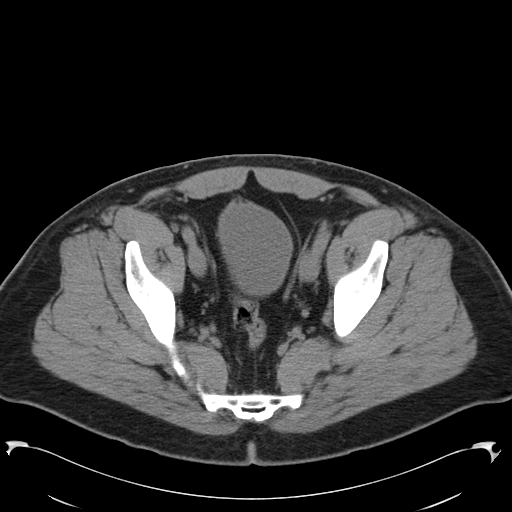
[im 31/95  soft-tissue]
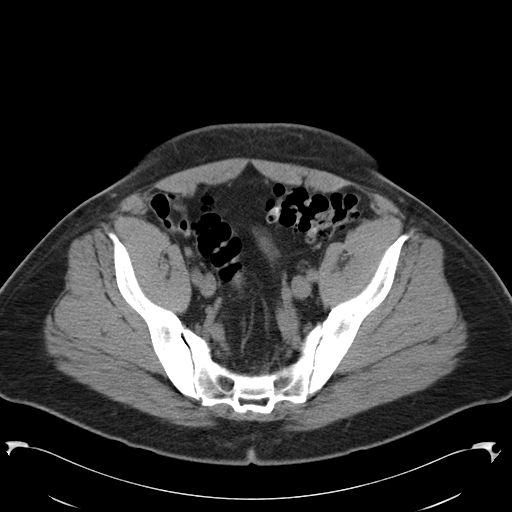
[im 38/95  soft-tissue]
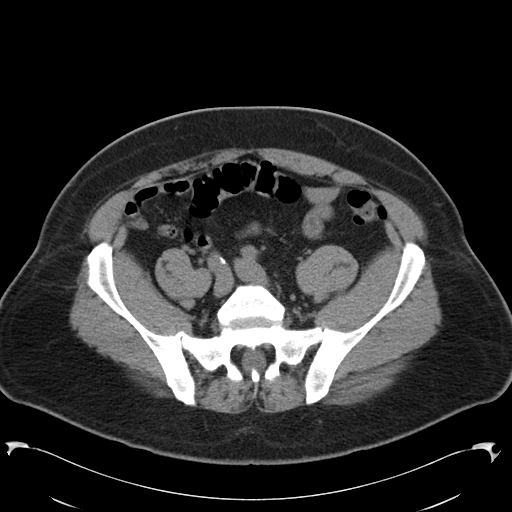
[im 46/95  soft-tissue]
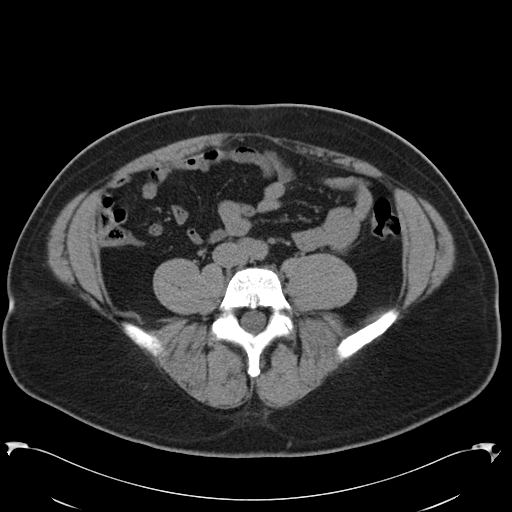
[im 49/95  soft-tissue]
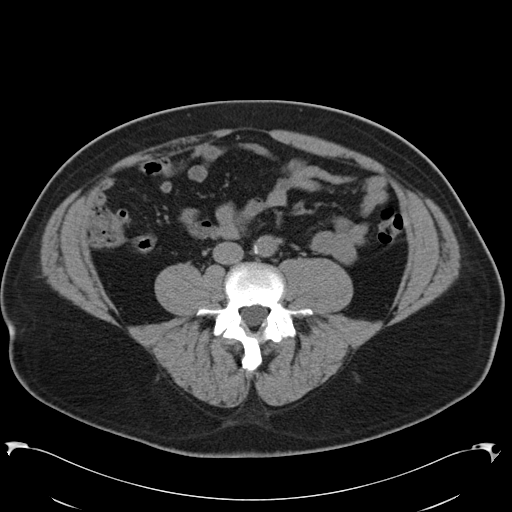
[im 57/95  soft-tissue]
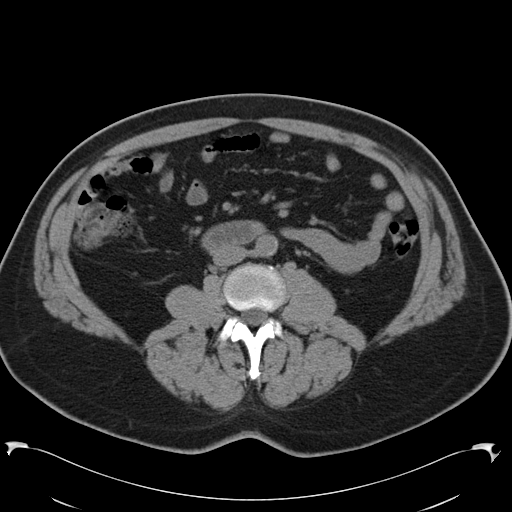
[im 57/95  bone]
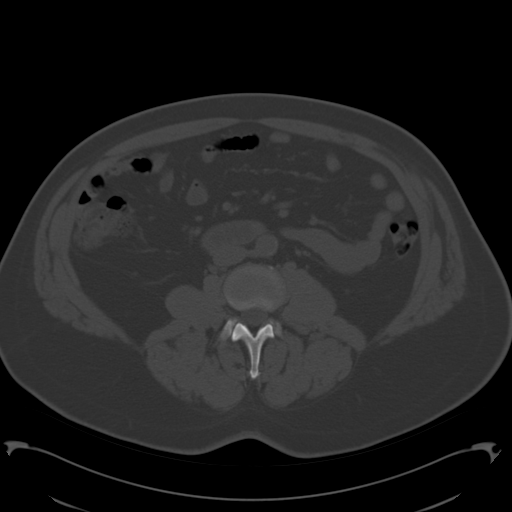
[im 64/95  soft-tissue]
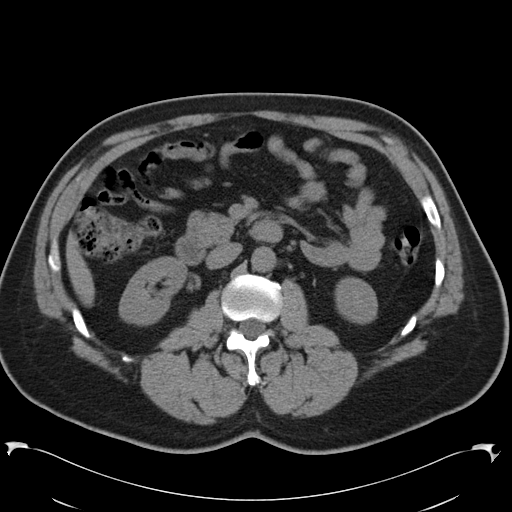
[im 72/95  soft-tissue]
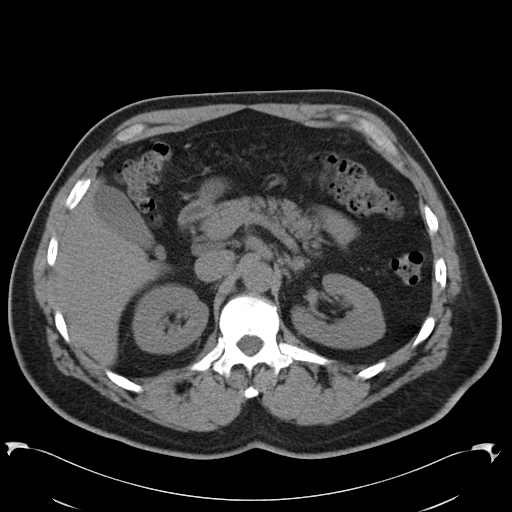
[im 76/95  soft-tissue]
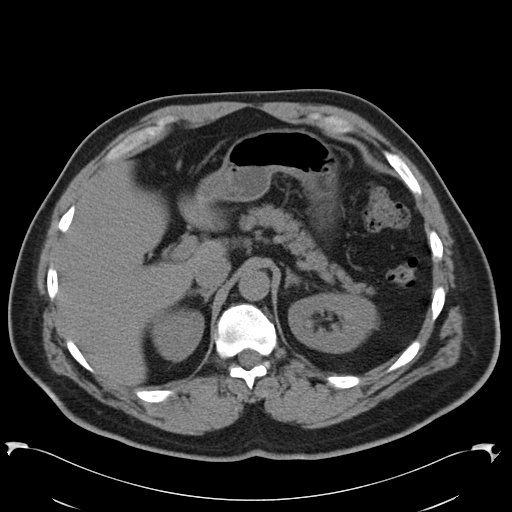
[im 83/95  soft-tissue]
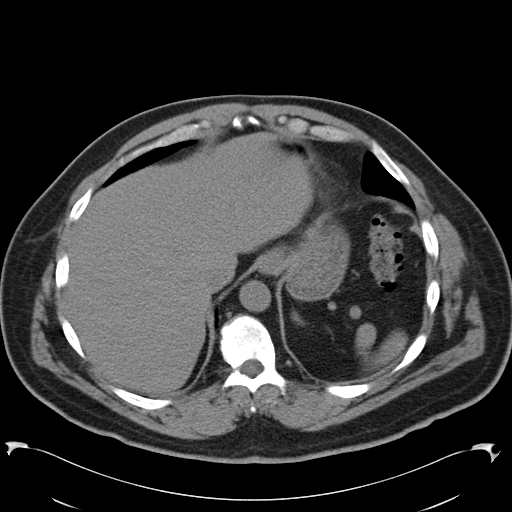
[im 91/95  soft-tissue]
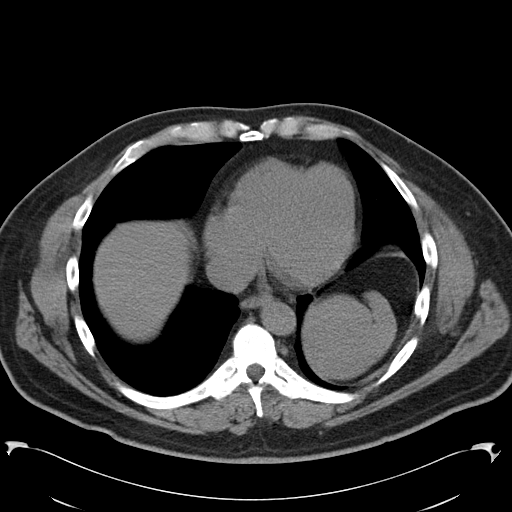

[Series 602: cor · coronal · 0.96mm/px · 3 of 125 slices shown]
[im 42/125  soft-tissue]
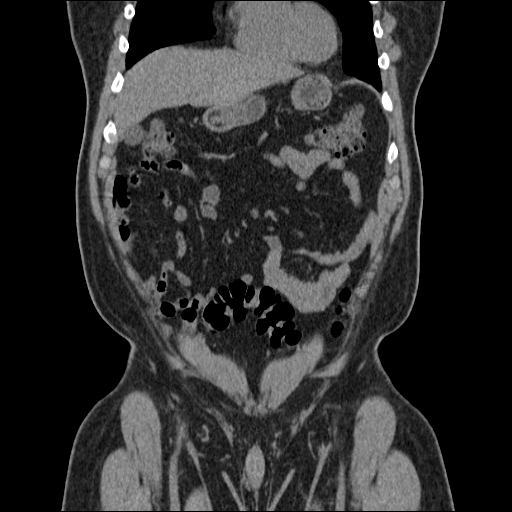
[im 56/125  soft-tissue]
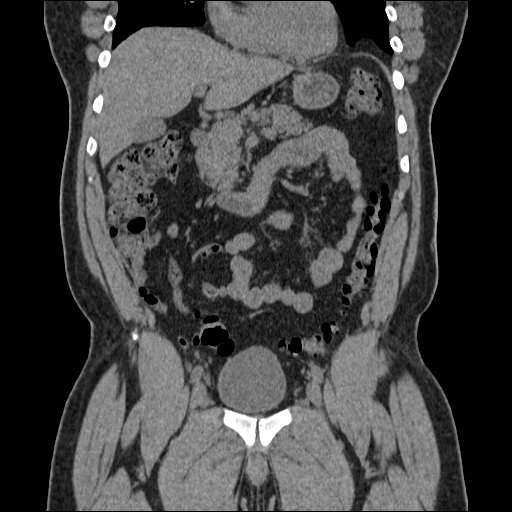
[im 69/125  soft-tissue]
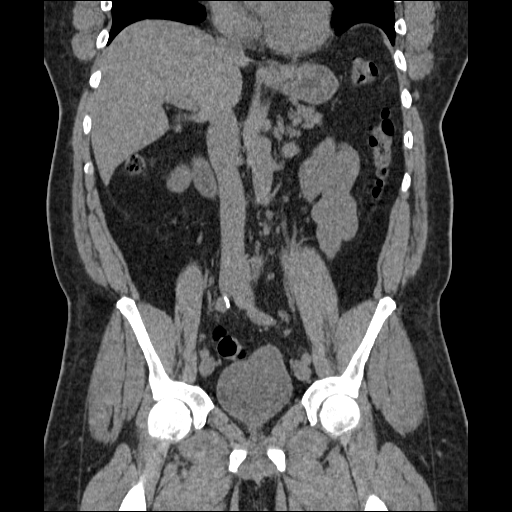

[17 of 46 positions shown; findings below may reference images not displayed]

FINDINGS: The lung bases are clear.  There is no pleural effusion.
There is no evidence of renal, ureteral or bladder calculus.  There
is no hydronephrosis or perinephric soft tissue stranding.  Both
kidneys appear unremarkable as imaged in the noncontrast state.

The liver demonstrates low density consistent with steatosis.  No
focal lesion is seen.  As evaluated in the noncontrast state, the
spleen, pancreas, gallbladder and adrenal glands appear normal.

The bowel gas pattern is normal.  Appendix appears normal.  No
inflammatory changes are evident.  There is no pelvic mass or
inflammatory process.  On the prior study, there were prominent
external iliac and right inguinal lymph nodes.  These have
resolved.

T11 butterfly vertebra is again noted.  No acute osseous findings
are seen.
IMPRESSION: 1.  No evidence of urinary tract calculus or hydronephrosis.
2.  No acute abdominal pelvic findings.
3.  Hepatic steatosis.
4.  T11 butterfly vertebral body.

## 2013-07-14 ENCOUNTER — Other Ambulatory Visit: Payer: Self-pay | Admitting: Cardiology

## 2013-07-21 ENCOUNTER — Telehealth: Payer: Self-pay

## 2013-07-21 MED ORDER — AMLODIPINE BESYLATE 5 MG PO TABS: 5.0000 mg | ORAL_TABLET | Freq: Every day | ORAL | Status: DC

## 2013-07-21 NOTE — Telephone Encounter (Signed)
Refilled blood pressure medication and informed patient needs appointment as not seen since 12/13.  The patient agreed and did schedule appointment.

## 2013-07-21 NOTE — Telephone Encounter (Signed)
The patient called and is hoping to get a refill of his blood pressure medicine   Callback - [phone]

## 2013-07-28 ENCOUNTER — Encounter: Payer: Self-pay | Admitting: Internal Medicine

## 2013-07-28 ENCOUNTER — Ambulatory Visit (INDEPENDENT_AMBULATORY_CARE_PROVIDER_SITE_OTHER): Payer: 59 | Admitting: Internal Medicine

## 2013-07-28 ENCOUNTER — Other Ambulatory Visit (INDEPENDENT_AMBULATORY_CARE_PROVIDER_SITE_OTHER): Payer: 59

## 2013-07-28 VITALS — BP 122/88 | HR 54 | Temp 98.3°F | Ht 73.0 in | Wt 253.0 lb

## 2013-07-28 DIAGNOSIS — Z0000 Encounter for general adult medical examination without abnormal findings: Secondary | ICD-10-CM

## 2013-07-28 DIAGNOSIS — E785 Hyperlipidemia, unspecified: Secondary | ICD-10-CM

## 2013-07-28 DIAGNOSIS — I10 Essential (primary) hypertension: Secondary | ICD-10-CM

## 2013-07-28 LAB — URINALYSIS, ROUTINE W REFLEX MICROSCOPIC
BILIRUBIN URINE: NEGATIVE
HGB URINE DIPSTICK: NEGATIVE
Ketones, ur: NEGATIVE
LEUKOCYTES UA: NEGATIVE
NITRITE: NEGATIVE
Specific Gravity, Urine: 1.025 (ref 1.000–1.030)
TOTAL PROTEIN, URINE-UPE24: NEGATIVE
UROBILINOGEN UA: 0.2 (ref 0.0–1.0)
Urine Glucose: NEGATIVE
pH: 6 (ref 5.0–8.0)

## 2013-07-28 LAB — CBC WITH DIFFERENTIAL/PLATELET
BASOS ABS: 0.1 10*3/uL (ref 0.0–0.1)
Basophils Relative: 0.6 % (ref 0.0–3.0)
EOS ABS: 0.1 10*3/uL (ref 0.0–0.7)
Eosinophils Relative: 0.8 % (ref 0.0–5.0)
HCT: 45.9 % (ref 39.0–52.0)
Hemoglobin: 15 g/dL (ref 13.0–17.0)
Lymphocytes Relative: 22.1 % (ref 12.0–46.0)
Lymphs Abs: 2.3 10*3/uL (ref 0.7–4.0)
MCHC: 32.6 g/dL (ref 30.0–36.0)
MCV: 79.9 fl (ref 78.0–100.0)
MONO ABS: 0.6 10*3/uL (ref 0.1–1.0)
Monocytes Relative: 5.4 % (ref 3.0–12.0)
NEUTROS PCT: 71.1 % (ref 43.0–77.0)
Neutro Abs: 7.5 10*3/uL (ref 1.4–7.7)
PLATELETS: 229 10*3/uL (ref 150.0–400.0)
RBC: 5.75 Mil/uL (ref 4.22–5.81)
RDW: 15.6 % — AB (ref 11.5–14.6)
WBC: 10.5 10*3/uL (ref 4.5–10.5)

## 2013-07-28 LAB — PSA: PSA: 1.16 ng/mL (ref 0.10–4.00)

## 2013-07-28 LAB — LIPID PANEL
CHOL/HDL RATIO: 3
CHOLESTEROL: 150 mg/dL (ref 0–200)
HDL: 45.7 mg/dL (ref >39.00)
LDL CALC: 89 mg/dL (ref 0–99)
Triglycerides: 78 mg/dL (ref 0.0–149.0)
VLDL: 15.6 mg/dL (ref 0.0–40.0)

## 2013-07-28 LAB — BASIC METABOLIC PANEL
BUN: 13 mg/dL (ref 6–23)
CHLORIDE: 106 meq/L (ref 96–112)
CO2: 26 meq/L (ref 19–32)
Calcium: 9.7 mg/dL (ref 8.4–10.5)
Creatinine, Ser: 1.1 mg/dL (ref 0.4–1.5)
GFR: 86.79 mL/min (ref >60.00)
Glucose, Bld: 91 mg/dL (ref 70–99)
POTASSIUM: 4.2 meq/L (ref 3.5–5.1)
Sodium: 140 mEq/L (ref 135–145)

## 2013-07-28 LAB — HEPATIC FUNCTION PANEL
ALT: 38 U/L (ref 0–53)
AST: 23 U/L (ref 0–37)
Albumin: 4.3 g/dL (ref 3.5–5.2)
Alkaline Phosphatase: 99 U/L (ref 39–117)
BILIRUBIN DIRECT: 0.2 mg/dL (ref 0.0–0.3)
BILIRUBIN TOTAL: 1.7 mg/dL — AB (ref 0.3–1.2)
TOTAL PROTEIN: 7 g/dL (ref 6.0–8.3)

## 2013-07-28 LAB — TSH: TSH: 0.83 u[IU]/mL (ref 0.35–5.50)

## 2013-07-28 MED ORDER — AMLODIPINE BESYLATE 5 MG PO TABS: 5.0000 mg | ORAL_TABLET | Freq: Every day | ORAL | Status: DC

## 2013-07-28 MED ORDER — ROSUVASTATIN CALCIUM 40 MG PO TABS: 40.0000 mg | ORAL_TABLET | Freq: Every day | ORAL | Status: DC

## 2013-07-28 NOTE — Assessment & Plan Note (Signed)
stable overall by history and exam, recent data reviewed with pt, and pt to continue medical treatment as before,  to f/u any worsening symptoms or concerns BP Readings from Last 3 Encounters:  07/28/13 122/88  03/01/13 164/98  04/28/12 122/80

## 2013-07-28 NOTE — Patient Instructions (Signed)
Please continue all other medications as before, and refills have been done if requested. Please have the pharmacy call with any other refills you may need.  Please continue your efforts at being more active, low cholesterol diet, and weight control.  You are otherwise up to date with prevention measures today.  Please go to the LAB in the Basement (turn left off the elevator) for the tests to be done today  You will be contacted by phone if any changes need to be made immediately.  Otherwise, you will receive a letter about your results with an explanation, but please check with MyChart first.  Please remember to sign up for MyChart if you have not done so, as this will be important to you in the future with finding out test results, communicating by private email, and scheduling acute appointments online when needed.  Please return in 1 year for your yearly visit, or sooner if needed, with Lab testing done 3-5 days before  

## 2013-07-28 NOTE — Assessment & Plan Note (Signed)

## 2013-07-28 NOTE — Progress Notes (Signed)
Pre visit review using our clinic review tool, if applicable. No additional management support is needed unless otherwise documented below in the visit note. 

## 2013-07-28 NOTE — Progress Notes (Signed)
Subjective:    Patient ID: Alejandro Middleton, male    DOB: [birth year], 53 y.o.   MRN: [id number]  HPI  Here for wellness and f/u;  Overall doing ok;  Pt denies CP, worsening SOB, DOE, wheezing, orthopnea, PND, worsening LE edema, palpitations, dizziness or syncope.  Pt denies neurological change such as new headache, facial or extremity weakness.  Pt denies polydipsia, polyuria, or low sugar symptoms. Pt states overall good compliance with treatment and medications, good tolerability, and has been trying to follow lower cholesterol diet.  Pt denies worsening depressive symptoms, suicidal ideation or panic. No fever, night sweats, wt loss, loss of appetite, or other constitutional symptoms.  Pt states good ability with ADL's, has low fall risk, home safety reviewed and adequate, no other significant changes in hearing or vision, and only occasionally active with exercise. No acute complaints Past Medical History  Diagnosis Date  . Hyperlipidemia   . Hypertension   . Erectile dysfunction   . OSA (obstructive sleep apnea)     s/p surgery  . History of renal stone 01/14/2012   Past Surgical History  Procedure Laterality Date  . S/p deviated nasal septum  2001  . S/p uvulopaltoplasty  2001  . Vasectomy      Dr. Janice Norrie    reports that he has never smoked. He does not have any smokeless tobacco history on file. He reports that he drinks alcohol. He reports that he does not use illicit drugs. family history includes Cancer in his sister; Glaucoma in his mother; Heart attack (age of onset: 44) in his father; Hypertension in his mother; Stroke (age of onset: 65) in his mother. No Known Allergies Current Outpatient Prescriptions on File Prior to Visit  Medication Sig Dispense Refill  . aspirin 81 MG EC tablet Take 81 mg by mouth daily.        Marland Kitchen VIAGRA 100 MG tablet        No current facility-administered medications on file prior to visit.   Review of Systems  Constitutional: Negative for  diaphoresis, activity change, appetite change or unexpected weight change.  HENT: Negative for hearing loss, ear pain, facial swelling, mouth sores and neck stiffness.   Eyes: Negative for pain, redness and visual disturbance.  Respiratory: Negative for shortness of breath and wheezing.   Cardiovascular: Negative for chest pain and palpitations.  Gastrointestinal: Negative for diarrhea, blood in stool, abdominal distention or other pain Genitourinary: Negative for hematuria, flank pain or change in urine volume.  Musculoskeletal: Negative for myalgias and joint swelling.  Skin: Negative for color change and wound.  Neurological: Negative for syncope and numbness. other than noted Hematological: Negative for adenopathy.  Psychiatric/Behavioral: Negative for hallucinations, self-injury, decreased concentration and agitation.        Objective:   Physical Exam BP 122/88  Pulse 54  Temp(Src) 98.3 F (36.8 C) (Oral)  Ht 6\' 1"  (1.854 m)  Wt 253 lb (114.76 kg)  BMI 33.39 kg/m2  SpO2 95% VS noted,  Constitutional: Pt is oriented to person, place, and time. Appears well-developed and well-nourished.  Head: Normocephalic and atraumatic.  Right Ear: External ear normal.  Left Ear: External ear normal.  Nose: Nose normal.  Mouth/Throat: Oropharynx is clear and moist.  Eyes: Conjunctivae and EOM are normal. Pupils are equal, round, and reactive to light.  Neck: Normal range of motion. Neck supple. No JVD present. No tracheal deviation present.  Cardiovascular: Normal rate, regular rhythm, normal heart sounds and intact distal pulses.  Pulmonary/Chest: Effort normal and breath sounds normal.  Abdominal: Soft. Bowel sounds are normal. There is no tenderness. No HSM  Musculoskeletal: Normal range of motion. Exhibits no edema.  Lymphadenopathy:  Has no cervical adenopathy.  Neurological: Pt is alert and oriented to person, place, and time. Pt has normal reflexes. No cranial nerve deficit.    Skin: Skin is warm and dry. No rash noted.  Psychiatric:  Has  normal mood and affect. Behavior is normal.      Assessment & Plan:

## 2013-07-28 NOTE — Assessment & Plan Note (Signed)
stable overall by history and exam, recent data reviewed with pt, and pt to continue medical treatment as before,  to f/u any worsening symptoms or concerns Lab Results  Component Value Date   LDLCALC 89 07/28/2013

## 2013-12-27 ENCOUNTER — Other Ambulatory Visit: Payer: Self-pay | Admitting: Internal Medicine

## 2013-12-28 ENCOUNTER — Other Ambulatory Visit: Payer: Self-pay | Admitting: Internal Medicine

## 2014-04-11 ENCOUNTER — Encounter: Payer: Self-pay | Admitting: Cardiology

## 2014-06-19 ENCOUNTER — Ambulatory Visit (INDEPENDENT_AMBULATORY_CARE_PROVIDER_SITE_OTHER): Payer: Self-pay | Admitting: Physician Assistant

## 2014-06-19 VITALS — BP 140/88 | HR 61 | Temp 98.3°F | Resp 18 | Ht 72.5 in | Wt 241.0 lb

## 2014-06-19 DIAGNOSIS — Z024 Encounter for examination for driving license: Secondary | ICD-10-CM

## 2014-06-19 DIAGNOSIS — Z029 Encounter for administrative examinations, unspecified: Secondary | ICD-10-CM

## 2014-06-19 NOTE — Progress Notes (Signed)
This patient presents for DOT examination for fitness for duty.  Last DOT certification was for 2 years.  Medical History: no  Any illness or injury in the last 5 years? no  Head/Brain Injuries, disorders or illnesses no  Seizures, epilepsy no  Eye disorders or impaired vision - does not wear corrective lenses no  Ear disorders, loss of hearing or balance no  Heart disease or heart attack; other cardiovascular condition no  Heart surgery (valve replacement/bypass, angioplasty, pacemaker) no  High blood pressure no  Muscular disease no  Shortness of breath no  Lung disease, emphysema, asthma, chronic bronchitis no  Kidney disease, dialysis no  Liver disease no  Digestive problems no  Diabetes or elevated blood sugar no  Nervious or psychiatric disorders, e.g., severe depression no  Loss of, or altered consciousness no  Fainting, dizziness no  Sleep disorders, pauses in breathing while asleep, daytime sleepiness, loud snoring no  Stroke or paralysis no  Missing or impaired hand, arm, foot, leg, finger, toe no  Spinal injury or disease no  Chronic low back pain no  Regular, frequent alcohol use no  Narcotic or habit forming drug use  Current Medications: Prior to Admission medications   Not on File    Primary Care Provider: Cathlean Cower, MD Specialists: none  Medical Examiner's Comments on Health History:  None - healthy - h/o high cholesterol but medications gave him SE  TESTING:   Visual Acuity Screening   Right eye Left eye Both eyes  Without correction: 20/25 20/30 -1 20/20  With correction:     Comments: The patient can distinguish the colors red, amber and green.  Horizontal Field of Vision-  L eye- 85 R eye- 85  Hearing Screening Comments: The patient was able to hear a forced whisper from 10 feet.  Monocular Vision: No.  Hearing Aid used for test: No. Hearing Aid required to to meet standard: No. Distance from individual at which forced whispered voice  can first be heard:   RIGHT ear 10 feet; LEFT ear 10 feet   BP 140/88 mmHg  Pulse 61  Temp(Src) 98.3 F (36.8 C) (Oral)  Resp 18  Ht 6' 0.5" (1.842 m)  Wt 241 lb (109.317 kg)  BMI 32.22 kg/m2  SpO2 99%   Pulse rate is regular  Urine Specimen: Specific Gravity 1.010, Protein trace, Blood neg, Sugar neg  Other Testing: none needed  PHYSICAL EXAMINATION:  1. No. General Appearance: Marked overweight, tremor, signs of alcoholism, problem drinking or drug abuse. 2. No. Eyes: pupillary equality, reaction to light, accommodation, ocular motility, ocular muscle imbalance, extra ocular movement, nystagmus, exopthalmos. Ask about retinopathy, cataracts, aphakia, glaucoma, macular degeneration and refer to a specialist if appropriate.  3. No. Ears: Scarring of tympanic membrane, occlusion of external canal, perforated eardrums.     4. No. Mouth and Throat: Irremedial deformities likely to interfere with breathing or swallowing.    5. No. Heart: Murmurs, extra sounds, enlarged heart, pacemaker, implantable defibrillator.     6. No. Lungs and Chest, not including breast examination: Abnormal Chest wall expansion, abnormal respiratory rate, abnormal breath sounds including wheezes or alveolar rates, impaired respiratory function, cyanosis. Abnormal findings on physical exam may require further testing such as pulmonary tests and/or x ray of chest.  7. No. Abdomen and Viscera: Enlarged liver, enlarged spleen, masses, bruits, hernia, significant abdominal wall muscle weakness.  8. No. Vascular System: Abnormal pulse and amplitude, carotid or arterial bruits, varicose veins.    9. No.  Genitourinary System: Hernia  10. No. Extremities-Limb impaired: Loss or impairment of leg, foot, toe, arm, hand, finger. Perceptible limp, deformities, atrophy, weakness, paralysis, clubbing, edema, hypotonia. Insufficient grasp and prehension to maintain steering wheel grip. Insufficient mobility and strength in lower  limb to operate pedals properly. 11. No. Spine, other musculoskeletal: Previous surgery, deformities, limitation of motion, tenderness.  12. No. Neurological: Impaired equilibrium, coordination or speech pattern; paresthesia, asymmetric deep tendon reflexes, sensory or positional abnormalities, abnormal patellar and Babinski's reflexes, ataxia.     Comments: none  Certification Status: does meet standards for 2 year certificate.  Wearing corrective lenses: no Wearing hearing aid: no Accompanied by a no waiver/exemption Skill performance Evaluation (SPE) Certificate: no Driving within an exempt intracity zone: no Qualified by operation of 49 CFR 893.73: no  Certification expires 06/19/2016

## 2016-01-15 ENCOUNTER — Ambulatory Visit (INDEPENDENT_AMBULATORY_CARE_PROVIDER_SITE_OTHER): Payer: Commercial Managed Care - HMO | Admitting: Internal Medicine

## 2016-01-15 ENCOUNTER — Encounter: Payer: Self-pay | Admitting: Internal Medicine

## 2016-01-15 VITALS — BP 140/82 | HR 64 | Temp 97.7°F | Resp 20 | Wt 256.0 lb

## 2016-01-15 DIAGNOSIS — Z1159 Encounter for screening for other viral diseases: Secondary | ICD-10-CM | POA: Diagnosis not present

## 2016-01-15 DIAGNOSIS — Z0001 Encounter for general adult medical examination with abnormal findings: Secondary | ICD-10-CM

## 2016-01-15 DIAGNOSIS — I10 Essential (primary) hypertension: Secondary | ICD-10-CM | POA: Diagnosis not present

## 2016-01-15 DIAGNOSIS — E785 Hyperlipidemia, unspecified: Secondary | ICD-10-CM

## 2016-01-15 DIAGNOSIS — R6889 Other general symptoms and signs: Secondary | ICD-10-CM | POA: Diagnosis not present

## 2016-01-15 MED ORDER — ASPIRIN EC 81 MG PO TBEC
81.0000 mg | DELAYED_RELEASE_TABLET | Freq: Every day | ORAL | 3 refills | Status: AC
Start: 2016-01-15 — End: ?

## 2016-01-15 MED ORDER — ROSUVASTATIN CALCIUM 20 MG PO TABS: 20.0000 mg | ORAL_TABLET | Freq: Every day | ORAL | 3 refills | Status: DC

## 2016-01-15 MED ORDER — LOSARTAN POTASSIUM-HCTZ 100-12.5 MG PO TABS: 1.0000 | ORAL_TABLET | Freq: Every day | ORAL | 3 refills | Status: DC

## 2016-01-15 NOTE — Progress Notes (Signed)
Pre visit review using our clinic review tool, if applicable. No additional management support is needed unless otherwise documented below in the visit note. 

## 2016-01-15 NOTE — Patient Instructions (Signed)
Please take all new medication as prescribed - the losartan/hct  Please also start aspirin 81 mg - 1 per day  Please continue all other medications as before, and refills have been done if requested - including the crestor  Please have the pharmacy call with any other refills you may need.  Please continue your efforts at being more active, low cholesterol diet, and weight control.  You are otherwise up to date with prevention measures today.  Please keep your appointments with your specialists as you may have planned  Please go to the LAB in the Basement (turn left off the elevator) for the tests to be done tomorrow or soon  You will be contacted by phone if any changes need to be made immediately.  Otherwise, you will receive a letter about your results with an explanation, but please check with MyChart first.  Please remember to sign up for MyChart if you have not done so, as this will be important to you in the future with finding out test results, communicating by private email, and scheduling acute appointments online when needed.  Please return in 6 months, or sooner if needed

## 2016-01-15 NOTE — Assessment & Plan Note (Signed)

## 2016-01-15 NOTE — Assessment & Plan Note (Signed)
Pt asks to re-start crestor, did well on that,  Lab Results  Component Value Date   LDLCALC 89 07/28/2013    to f/u any worsening symptoms or concerns

## 2016-01-15 NOTE — Assessment & Plan Note (Signed)
Uncontrolled, to start losartan/hct 100/12.5 qd, has f/u DOT BP check in 1 wk, to cont to monitor at home as well,  to f/u any worsening symptoms or concerns, to work on wt loss, exercise  BP Readings from Last 3 Encounters:  01/15/16 140/82  06/19/14 140/88  07/28/13 122/88   In addition to the time spent performing CPE, I spent an additional 25 minutes face to face,in which greater than 50% of this time was spent in counseling and coordination of care for patient's acute illness as documented.

## 2016-01-15 NOTE — Progress Notes (Signed)
Subjective:    Patient ID: Alejandro Middleton, male    DOB: [birth year], 55 y.o.   MRN: [id number]  HPI  Here for wellness and f/u;  Overall doing ok;  Pt denies neurological change such as new headache, facial or extremity weakness.  Pt denies polydipsia, polyuria, or low sugar symptoms. Pt states overall good compliance with treatment and medications, good tolerability, and has been trying to follow appropriate diet.  Pt denies worsening depressive symptoms, suicidal ideation or panic. No fever, night sweats, wt loss, loss of appetite, or other constitutional symptoms.  Pt states good ability with ADL's, has low fall risk, home safety reviewed and adequate, no other significant changes in hearing or vision, and not active with exercise.  Pt denies chest pain, increased sob or doe, wheezing, orthopnea, PND, increased LE swelling, palpitations, dizziness or syncope.  Has been tx for HTN in past, has not f/u, out of meds, and BP elevated 184/120 at recent exam for DOT physical.  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   Past Medical History:  Diagnosis Date  . Erectile dysfunction   . History of renal stone 01/14/2012  . Hyperlipidemia   . Hypertension   . OSA (obstructive sleep apnea)    s/p surgery   Past Surgical History:  Procedure Laterality Date  . s/p deviated nasal septum  2001  . s/p uvulopaltoplasty  2001  . VASECTOMY     Dr. Janice Norrie    reports that he has never smoked. He does not have any smokeless tobacco history on file. He reports that he drinks alcohol. He reports that he does not use drugs. family history includes Cancer in his sister; Glaucoma in his mother; Heart attack (age of onset: 21) in his father; Hypertension in his mother; Stroke (age of onset: 67) in his mother. No Known Allergies No current outpatient prescriptions on file prior to visit.   No current facility-administered medications on  file prior to visit.    Review of Systems Constitutional: Negative for increased diaphoresis, or other activity, appetite or siginficant weight change other than noted HENT: Negative for worsening hearing loss, ear pain, facial swelling, mouth sores and neck stiffness.   Eyes: Negative for other worsening pain, redness or visual disturbance.  Respiratory: Negative for choking or stridor Cardiovascular: Negative for other chest pain and palpitations.  Gastrointestinal: Negative for worsening diarrhea, blood in stool, or abdominal distention Genitourinary: Negative for hematuria, flank pain or change in urine volume.  Musculoskeletal: Negative for myalgias or other joint complaints.  Skin: Negative for other color change and wound or drainage.  Neurological: Negative for syncope and numbness. other than noted Hematological: Negative for adenopathy. or other swelling Psychiatric/Behavioral: Negative for hallucinations, SI, self-injury, decreased concentration or other worsening agitation.      Objective:   Physical Exam BP 140/82   Pulse 64   Temp 97.7 F (36.5 C) (Oral)   Resp 20   Wt 256 lb (116.1 kg)   SpO2 97%   BMI 34.24 kg/m  VS noted,  Constitutional: Pt is oriented to person, place, and time. Appears well-developed and well-nourished, in no significant distress Head: Normocephalic and atraumatic  Eyes: Conjunctivae and EOM are normal. Pupils are equal, round, and reactive to light Right Ear: External ear normal.  Left Ear: External ear normal Nose: Nose normal.  Mouth/Throat: Oropharynx is clear and moist  Neck: Normal range of motion.  Neck supple. No JVD present. No tracheal deviation present or significant neck LA or mass Cardiovascular: Normal rate, regular rhythm, normal heart sounds and intact distal pulses.   Pulmonary/Chest: Effort normal and breath sounds without rales or wheezing  Abdominal: Soft. Bowel sounds are normal. NT. No HSM  Musculoskeletal: Normal range  of motion. Exhibits no edema Lymphadenopathy: Has no cervical adenopathy.  Neurological: Pt is alert and oriented to person, place, and time. Pt has normal reflexes. No cranial nerve deficit. Motor grossly intact Skin: Skin is warm and dry. No rash noted or new ulcers Psychiatric:  Has normal mood and affect. Behavior is normal.     Assessment & Plan:

## 2016-01-16 ENCOUNTER — Other Ambulatory Visit: Payer: Self-pay | Admitting: Internal Medicine

## 2016-01-16 ENCOUNTER — Other Ambulatory Visit (INDEPENDENT_AMBULATORY_CARE_PROVIDER_SITE_OTHER): Payer: Commercial Managed Care - HMO

## 2016-01-16 DIAGNOSIS — Z1159 Encounter for screening for other viral diseases: Secondary | ICD-10-CM

## 2016-01-16 DIAGNOSIS — Z0001 Encounter for general adult medical examination with abnormal findings: Secondary | ICD-10-CM | POA: Diagnosis not present

## 2016-01-16 DIAGNOSIS — R6889 Other general symptoms and signs: Secondary | ICD-10-CM | POA: Diagnosis not present

## 2016-01-16 LAB — LIPID PANEL
CHOL/HDL RATIO: 6
Cholesterol: 305 mg/dL — ABNORMAL HIGH (ref 0–200)
HDL: 52.1 mg/dL (ref >39.00)
LDL Cholesterol: 232 mg/dL — ABNORMAL HIGH (ref 0–99)
NONHDL: 253.24
Triglycerides: 107 mg/dL (ref 0.0–149.0)
VLDL: 21.4 mg/dL (ref 0.0–40.0)

## 2016-01-16 LAB — CBC WITH DIFFERENTIAL/PLATELET
BASOS PCT: 0.5 % (ref 0.0–3.0)
Basophils Absolute: 0 10*3/uL (ref 0.0–0.1)
EOS ABS: 0.1 10*3/uL (ref 0.0–0.7)
Eosinophils Relative: 0.7 % (ref 0.0–5.0)
HEMATOCRIT: 43.6 % (ref 39.0–52.0)
Hemoglobin: 14.6 g/dL (ref 13.0–17.0)
LYMPHS ABS: 2 10*3/uL (ref 0.7–4.0)
LYMPHS PCT: 18.8 % (ref 12.0–46.0)
MCHC: 33.4 g/dL (ref 30.0–36.0)
MCV: 77.6 fl — ABNORMAL LOW (ref 78.0–100.0)
Monocytes Absolute: 0.4 10*3/uL (ref 0.1–1.0)
Monocytes Relative: 4.2 % (ref 3.0–12.0)
NEUTROS ABS: 8 10*3/uL — AB (ref 1.4–7.7)
Neutrophils Relative %: 75.8 % (ref 43.0–77.0)
PLATELETS: 272 10*3/uL (ref 150.0–400.0)
RBC: 5.63 Mil/uL (ref 4.22–5.81)
RDW: 15.6 % — AB (ref 11.5–15.5)
WBC: 10.6 10*3/uL — ABNORMAL HIGH (ref 4.0–10.5)

## 2016-01-16 LAB — BASIC METABOLIC PANEL
BUN: 12 mg/dL (ref 6–23)
CHLORIDE: 103 meq/L (ref 96–112)
CO2: 28 mEq/L (ref 19–32)
CREATININE: 1.33 mg/dL (ref 0.40–1.50)
Calcium: 9.4 mg/dL (ref 8.4–10.5)
GFR: 71.27 mL/min (ref >60.00)
Glucose, Bld: 100 mg/dL — ABNORMAL HIGH (ref 70–99)
POTASSIUM: 3.8 meq/L (ref 3.5–5.1)
SODIUM: 139 meq/L (ref 135–145)

## 2016-01-16 LAB — HEPATIC FUNCTION PANEL
ALK PHOS: 86 U/L (ref 39–117)
ALT: 15 U/L (ref 0–53)
AST: 15 U/L (ref 0–37)
Albumin: 4.5 g/dL (ref 3.5–5.2)
BILIRUBIN DIRECT: 0.2 mg/dL (ref 0.0–0.3)
BILIRUBIN TOTAL: 1.3 mg/dL — AB (ref 0.2–1.2)
Total Protein: 7.2 g/dL (ref 6.0–8.3)

## 2016-01-16 LAB — URINALYSIS, ROUTINE W REFLEX MICROSCOPIC
Bilirubin Urine: NEGATIVE
HGB URINE DIPSTICK: NEGATIVE
Ketones, ur: NEGATIVE
Leukocytes, UA: NEGATIVE
Nitrite: NEGATIVE
Specific Gravity, Urine: 1.025 (ref 1.000–1.030)
TOTAL PROTEIN, URINE-UPE24: NEGATIVE
Urine Glucose: NEGATIVE
Urobilinogen, UA: 0.2 (ref 0.0–1.0)
pH: 6 (ref 5.0–8.0)

## 2016-01-16 LAB — PSA: PSA: 1.38 ng/mL (ref 0.10–4.00)

## 2016-01-16 LAB — TSH: TSH: 1.19 u[IU]/mL (ref 0.35–4.50)

## 2016-01-16 MED ORDER — ROSUVASTATIN CALCIUM 40 MG PO TABS: 40.0000 mg | ORAL_TABLET | Freq: Every day | ORAL | 3 refills | Status: DC

## 2016-01-17 LAB — HEPATITIS C ANTIBODY: HCV Ab: NEGATIVE

## 2016-06-08 ENCOUNTER — Ambulatory Visit (INDEPENDENT_AMBULATORY_CARE_PROVIDER_SITE_OTHER): Payer: Self-pay | Admitting: Physician Assistant

## 2016-06-08 VITALS — BP 150/90 | HR 78 | Temp 98.6°F | Resp 16 | Ht 72.5 in | Wt 269.0 lb

## 2016-06-08 DIAGNOSIS — Z024 Encounter for examination for driving license: Secondary | ICD-10-CM

## 2016-06-08 NOTE — Patient Instructions (Signed)
     IF you received an x-ray today, you will receive an invoice from [facility]. Please contact [facility] at [phone] with questions or concerns regarding your invoice.   IF you received labwork today, you will receive an invoice from [facility]. Please contact [facility] at [phone] with questions or concerns regarding your invoice.   Our billing staff will not be able to assist you with questions regarding bills from these companies.  You will be contacted with the lab results as soon as they are available. The fastest way to get your results is to activate your My Chart account. Instructions are located on the last page of this paperwork. If you have not heard from us regarding the results in 2 weeks, please contact this office.     

## 2016-06-10 NOTE — Progress Notes (Signed)
[facility] [address], [place] [address] [phone]  Date:  06/08/2016   Name:  Alejandro Middleton   DOB:  09/26/1960   MRN:  [id number]  PCP:  Cathlean Cower, MD    History of Present Illness:  Alejandro Middleton is a 56 y.o. male patient who presents to [facility] for DOT No concerns or complaints at this time Compliant with taking anti-hypertensives.  Not currently utilizing dot at this time.       Patient Active Problem List   Diagnosis Date Noted  . Obesity 04/28/2012  . Gross hematuria 01/14/2012  . History of renal stone 01/14/2012  . Encounter for well adult exam with abnormal findings 12/12/2010  . Hyperlipidemia 06/19/2009  . SLEEP APNEA, OBSTRUCTIVE 06/19/2009  . Essential hypertension 06/19/2009  . 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 Erectile dysfunction   . History of renal stone 01/14/2012  . Hyperlipidemia   . Hypertension   . OSA (obstructive sleep apnea)    s/p surgery    Past Surgical History:  Procedure Laterality Date  . s/p deviated nasal septum  2001  . s/p uvulopaltoplasty  2001  . VASECTOMY     Dr. Janice Norrie    Social History  Substance Use Topics  . Smoking status: Never Smoker  . Smokeless tobacco: Not on file     Comment: high school only  . Alcohol use Yes     Comment: rare    Family History  Problem Relation Age of Onset  . Cancer Sister     breast  . Hypertension Mother   . Glaucoma Mother   . Stroke Mother 27  . Heart attack Father 73    No Known Allergies  Medication list has been reviewed and updated.  Current Outpatient Prescriptions on File Prior to Visit  Medication Sig Dispense Refill  . aspirin EC 81 MG tablet Take 1 tablet (81 mg total) by mouth daily. 90 tablet 3  . losartan-hydrochlorothiazide (HYZAAR) 100-12.5 MG tablet Take 1 tablet by mouth daily. 90 tablet 3  . rosuvastatin (CRESTOR) 40 MG tablet Take 1 tablet (40 mg total) by mouth daily. 90  tablet 3   No current facility-administered medications on file prior to visit.     Review of Systems  Constitutional: Negative for chills and fever.  HENT: Negative for ear discharge, ear pain and sore throat.   Eyes: Negative for blurred vision and double vision.  Respiratory: Negative for cough, shortness of breath and wheezing.   Cardiovascular: Negative for chest pain, palpitations and leg swelling.  Gastrointestinal: Negative for diarrhea, nausea and vomiting.  Genitourinary: Negative for dysuria, frequency and hematuria.  Skin: Negative for itching and rash.  Neurological: Negative for dizziness and headaches.     Physical Examination: BP (!) 150/90   Pulse 78   Temp 98.6 F (37 C) (Oral)   Resp 16   Ht 6' 0.5" (1.842 m)   Wt 269 lb (122 kg)   SpO2 97%   BMI 35.98 kg/m  Ideal Body Weight: Weight in (lb) to have BMI = 25: 186.5  Physical Exam  Constitutional: He is oriented to person, place, and time. He appears well-developed and well-nourished. No distress.  HENT:  Head: Normocephalic and atraumatic.  Right Ear: Tympanic membrane, external ear and ear canal normal.  Left Ear: Tympanic membrane, external ear and ear canal normal.  Eyes: Conjunctivae and EOM are normal. Pupils are  equal, round, and reactive to light.  Cardiovascular: Normal rate and regular rhythm.  Exam reveals no friction rub.   No murmur heard. Pulmonary/Chest: Effort normal. No respiratory distress. He has no wheezes.  Abdominal: Soft. Bowel sounds are normal. He exhibits no distension and no mass. There is no tenderness.  Musculoskeletal: Normal range of motion. He exhibits no edema or tenderness.  Neurological: He is alert and oriented to person, place, and time. He displays normal reflexes.  Skin: Skin is warm and dry. He is not diaphoretic.  Psychiatric: He has a normal mood and affect. His behavior is normal.     Assessment and Plan: Alejandro Middleton is a 56 y.o. male who is here today  for DOT 3 month given for inability to have dot therapeutic bp.  Will return within that time frame for recheck. Encounter for Department of Transportation (DOT) examination for driving license renewal  Ivar Drape, PA-C [facility] 1/17/201810:08 AM

## 2016-07-06 DIAGNOSIS — N400 Enlarged prostate without lower urinary tract symptoms: Secondary | ICD-10-CM | POA: Diagnosis not present

## 2016-07-06 DIAGNOSIS — Z125 Encounter for screening for malignant neoplasm of prostate: Secondary | ICD-10-CM | POA: Diagnosis not present

## 2016-07-24 DIAGNOSIS — M79605 Pain in left leg: Secondary | ICD-10-CM | POA: Diagnosis not present

## 2016-08-25 ENCOUNTER — Other Ambulatory Visit: Payer: Self-pay | Admitting: Physician Assistant

## 2016-08-25 ENCOUNTER — Ambulatory Visit (INDEPENDENT_AMBULATORY_CARE_PROVIDER_SITE_OTHER): Payer: Commercial Managed Care - HMO | Admitting: Physician Assistant

## 2016-08-25 VITALS — BP 154/90 | Temp 97.7°F | Ht 72.5 in | Wt 258.8 lb

## 2016-08-25 DIAGNOSIS — I10 Essential (primary) hypertension: Secondary | ICD-10-CM | POA: Diagnosis not present

## 2016-08-25 DIAGNOSIS — I159 Secondary hypertension, unspecified: Secondary | ICD-10-CM | POA: Diagnosis not present

## 2016-08-25 MED ORDER — AMLODIPINE BESYLATE 5 MG PO TABS: 5.0000 mg | ORAL_TABLET | Freq: Every day | ORAL | 1 refills | Status: DC

## 2016-08-25 NOTE — Patient Instructions (Addendum)
IF you received an x-ray today, you will receive an invoice from [facility]. Please contact [facility] at [phone] with questions or concerns regarding your invoice.   IF you received labwork today, you will receive an invoice from [facility]. Please contact [facility] at [phone] with questions or concerns regarding your invoice.   Our billing staff will not be able to assist you with questions regarding bills from these companies.  You will be contacted with the lab results as soon as they are available. The fastest way to get your results is to activate your My Chart account. Instructions are located on the last page of this paperwork. If you have not heard from [place] regarding the results in 2 weeks, please contact this office.    Check your blood pressure twice per week.   Make sure you hydrating well.  DASH Eating Plan DASH stands for "Dietary Approaches to Stop Hypertension." The DASH eating plan is a healthy eating plan that has been shown to reduce high blood pressure (hypertension). It may also reduce your risk for type 2 diabetes, heart disease, and stroke. The DASH eating plan may also help with weight loss. What are tips for following this plan? General guidelines   Avoid eating more than 2,300 mg (milligrams) of salt (sodium) a day. If you have hypertension, you may need to reduce your sodium intake to 1,500 mg a day.  Limit alcohol intake to no more than 1 drink a day for nonpregnant women and 2 drinks a day for men. One drink equals 12 oz of beer, 5 oz of wine, or 1 oz of hard liquor.  Work with your health care provider to maintain a healthy body weight or to lose weight. Ask what an ideal weight is for you.  Get at least 30 minutes of exercise that causes your heart to beat faster (aerobic exercise) most days of the week. Activities may include walking, swimming, or biking.  Work with your health care provider or diet and nutrition specialist  (dietitian) to adjust your eating plan to your individual calorie needs. Reading food labels   Check food labels for the amount of sodium per serving. Choose foods with less than 5 percent of the Daily Value of sodium. Generally, foods with less than 300 mg of sodium per serving fit into this eating plan.  To find whole grains, look for the word "whole" as the first word in the ingredient list. Shopping   Buy products labeled as "low-sodium" or "no salt added."  Buy fresh foods. Avoid canned foods and premade or frozen meals. Cooking   Avoid adding salt when cooking. Use salt-free seasonings or herbs instead of table salt or sea salt. Check with your health care provider or pharmacist before using salt substitutes.  Do not fry foods. Cook foods using healthy methods such as baking, boiling, grilling, and broiling instead.  Cook with heart-healthy oils, such as olive, canola, soybean, or sunflower oil. Meal planning    Eat a balanced diet that includes:  5 or more servings of fruits and vegetables each day. At each meal, try to fill half of your plate with fruits and vegetables.  Up to 6-8 servings of whole grains each day.  Less than 6 oz of lean meat, poultry, or fish each day. A 3-oz serving of meat is about the same size as a deck of cards. One egg equals 1 oz.  2 servings of low-fat dairy each day.  A serving of  nuts, seeds, or beans 5 times each week.  Heart-healthy fats. Healthy fats called Omega-3 fatty acids are found in foods such as flaxseeds and coldwater fish, like sardines, salmon, and mackerel.  Limit how much you eat of the following:  Canned or prepackaged foods.  Food that is high in trans fat, such as fried foods.  Food that is high in saturated fat, such as fatty meat.  Sweets, desserts, sugary drinks, and other foods with added sugar.  Full-fat dairy products.  Do not salt foods before eating.  Try to eat at least 2 vegetarian meals each  week.  Eat more home-cooked food and less restaurant, buffet, and fast food.  When eating at a restaurant, ask that your food be prepared with less salt or no salt, if possible. What foods are recommended? The items listed may not be a complete list. Talk with your dietitian about what dietary choices are best for you. Grains  Whole-grain or whole-wheat bread. Whole-grain or whole-wheat pasta. Brown rice. [place]. Bulgur. Whole-grain and low-sodium cereals. Pita bread. Low-fat, low-sodium crackers. Whole-wheat flour tortillas. Vegetables  Fresh or frozen vegetables (raw, steamed, roasted, or grilled). Low-sodium or reduced-sodium tomato and vegetable juice. Low-sodium or reduced-sodium tomato sauce and tomato paste. Low-sodium or reduced-sodium canned vegetables. Fruits  All fresh, dried, or frozen fruit. Canned fruit in natural juice (without added sugar). Meat and other protein foods  Skinless chicken or [place]. Ground chicken or [place]. Pork with fat trimmed off. Fish and seafood. Egg whites. Dried beans, peas, or lentils. Unsalted nuts, nut butters, and seeds. Unsalted canned beans. Lean cuts of beef with fat trimmed off. Low-sodium, lean deli meat. Dairy  Low-fat (1%) or fat-free (skim) milk. Fat-free, low-fat, or reduced-fat cheeses. Nonfat, low-sodium ricotta or cottage cheese. Low-fat or nonfat yogurt. Low-fat, low-sodium cheese. Fats and oils  Soft margarine without trans fats. Vegetable oil. Low-fat, reduced-fat, or light mayonnaise and salad dressings (reduced-sodium). Canola, safflower, olive, soybean, and sunflower oils. Avocado. Seasoning and other foods  Herbs. Spices. Seasoning mixes without salt. Unsalted popcorn and pretzels. Fat-free sweets. What foods are not recommended? The items listed may not be a complete list. Talk with your dietitian about what dietary choices are best for you. Grains  Baked goods made with fat, such as croissants, muffins, or some breads.  Dry pasta or rice meal packs. Vegetables  Creamed or fried vegetables. Vegetables in a cheese sauce. Regular canned vegetables (not low-sodium or reduced-sodium). Regular canned tomato sauce and paste (not low-sodium or reduced-sodium). Regular tomato and vegetable juice (not low-sodium or reduced-sodium). Angie Fava. Olives. Fruits  Canned fruit in a light or heavy syrup. Fried fruit. Fruit in cream or butter sauce. Meat and other protein foods  Fatty cuts of meat. Ribs. Fried meat. Berniece Salines. Sausage. Bologna and other processed lunch meats. Salami. Fatback. Hotdogs. Bratwurst. Salted nuts and seeds. Canned beans with added salt. Canned or smoked fish. Whole eggs or egg yolks. Chicken or [place] with skin. Dairy  Whole or 2% milk, cream, and half-and-half. Whole or full-fat cream cheese. Whole-fat or sweetened yogurt. Full-fat cheese. Nondairy creamers. Whipped toppings. Processed cheese and cheese spreads. Fats and oils  Butter. Stick margarine. Lard. Shortening. Ghee. Bacon fat. Tropical oils, such as coconut, palm kernel, or palm oil. Seasoning and other foods  Salted popcorn and pretzels. Onion salt, garlic salt, seasoned salt, table salt, and sea salt. Worcestershire sauce. Tartar sauce. Barbecue sauce. Teriyaki sauce. Soy sauce, including reduced-sodium. Steak sauce. Canned and packaged gravies. Fish sauce. [employer]  sauce. Cocktail sauce. Horseradish that you find on the shelf. Ketchup. Mustard. Meat flavorings and tenderizers. Bouillon cubes. Hot sauce and Tabasco sauce. Premade or packaged marinades. Premade or packaged taco seasonings. Relishes. Regular salad dressings. Where to find more information:  National Heart, Lung, and [place]: https://wilson-eaton.com/  American Heart Association: www.heart.org Summary  The DASH eating plan is a healthy eating plan that has been shown to reduce high blood pressure (hypertension). It may also reduce your risk for type 2 diabetes, heart disease, and  stroke.  With the DASH eating plan, you should limit salt (sodium) intake to 2,300 mg a day. If you have hypertension, you may need to reduce your sodium intake to 1,500 mg a day.  When on the DASH eating plan, aim to eat more fresh fruits and vegetables, whole grains, lean proteins, low-fat dairy, and heart-healthy fats.  Work with your health care provider or diet and nutrition specialist (dietitian) to adjust your eating plan to your individual calorie needs. This information is not intended to replace advice given to you by your health care provider. Make sure you discuss any questions you have with your health care provider. Document Released: 04/30/2011 Document Revised: 05/04/2016 Document Reviewed: 05/04/2016 Elsevier Interactive Patient Education  2017 [employer].

## 2016-08-26 LAB — CMP14+EGFR
A/G RATIO: 2.3 — AB (ref 1.2–2.2)
ALBUMIN: 5 g/dL (ref 3.5–5.5)
ALK PHOS: 97 IU/L (ref 39–117)
ALT: 24 IU/L (ref 0–44)
AST: 17 IU/L (ref 0–40)
BILIRUBIN TOTAL: 1.4 mg/dL — AB (ref 0.0–1.2)
BUN / CREAT RATIO: 11 (ref 9–20)
BUN: 14 mg/dL (ref 6–24)
CALCIUM: 9.6 mg/dL (ref 8.7–10.2)
CHLORIDE: 99 mmol/L (ref 96–106)
CO2: 24 mmol/L (ref 18–29)
Creatinine, Ser: 1.22 mg/dL (ref 0.76–1.27)
GFR calc Af Amer: 76 mL/min/{1.73_m2} (ref >59)
GFR calc non Af Amer: 65 mL/min/{1.73_m2} (ref >59)
GLOBULIN, TOTAL: 2.2 g/dL (ref 1.5–4.5)
Glucose: 92 mg/dL (ref 65–99)
Potassium: 4.3 mmol/L (ref 3.5–5.2)
SODIUM: 142 mmol/L (ref 134–144)
TOTAL PROTEIN: 7.2 g/dL (ref 6.0–8.5)

## 2016-08-26 NOTE — Progress Notes (Signed)
PRIMARY CARE AT [place], [place] [address] [phone]  Date:  08/25/2016   Name:  Alejandro Middleton   DOB:  10-13-1960   MRN:  [id number]  PCP:  Cathlean Cower, MD    History of Present Illness:  Alejandro Middleton is a 56 y.o. male patient who presents to PCP with  Chief Complaint  Patient presents with  . Employment Physical    DOT     Patient returns to the clinic with cc of obtaining DOT after extension.    Patient comes here with blood pressure rechecked several times and remains over 150/90.  He is to maintain under 140/90.  He was seen here for her DOT 06/08/2016 about 3 months ago.  Blood pressure is 150/90, and advised to follow up with her pcp, Cathlean Cower to follow up and manage his bp.  He had never returned to his primary care provider.  He was given 3 months to have this rechecked (exp 09/09/2016).  He reports that he was was taking the blood pressure medication prior to seeing me and compliant.  He assumed if he changed his diet (focusing on less sodium and fresh fruits and vegetables) would change this.  No chest pains, sob, leg swelling, vision changes.     Patient Active Problem List   Diagnosis Date Noted  . Obesity 04/28/2012  . Gross hematuria 01/14/2012  . History of renal stone 01/14/2012  . Encounter for well adult exam with abnormal findings 12/12/2010  . Hyperlipidemia 06/19/2009  . SLEEP APNEA, OBSTRUCTIVE 06/19/2009  . Essential hypertension 06/19/2009  . 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 Erectile dysfunction   . History of renal stone 01/14/2012  . Hyperlipidemia   . Hypertension   . OSA (obstructive sleep apnea)    s/p surgery    Past Surgical History:  Procedure Laterality Date  . s/p deviated nasal septum  2001  . s/p uvulopaltoplasty  2001  . VASECTOMY     Dr. Janice Norrie    Social History  Substance Use Topics  . Smoking status: Never Smoker  . Smokeless tobacco: Never Used   Comment: high school only  . Alcohol use Yes     Comment: rare    Family History  Problem Relation Age of Onset  . Cancer Sister     breast  . Hypertension Mother   . Glaucoma Mother   . Stroke Mother 24  . Heart attack Father 48    No Known Allergies  Medication list has been reviewed and updated.  Current Outpatient Prescriptions on File Prior to Visit  Medication Sig Dispense Refill  . aspirin EC 81 MG tablet Take 1 tablet (81 mg total) by mouth daily. 90 tablet 3  . losartan-hydrochlorothiazide (HYZAAR) 100-12.5 MG tablet Take 1 tablet by mouth daily. 90 tablet 3  . rosuvastatin (CRESTOR) 40 MG tablet Take 1 tablet (40 mg total) by mouth daily. 90 tablet 3   No current facility-administered medications on file prior to visit.     ROS ROS otherwise unremarkable unless listed above.  Physical Examination: BP (!) 154/90   Temp 97.7 F (36.5 C) (Oral)   Ht 6' 0.5" (1.842 m)   Wt 258 lb 12.8 oz (117.4 kg)   BMI 34.62 kg/m  Ideal Body Weight: Weight in (lb) to have BMI = 25: 186.5  Physical Exam  Constitutional: He is oriented to person, place, and time. He appears well-developed  and well-nourished. No distress.  HENT:  Head: Normocephalic and atraumatic.  Eyes: Conjunctivae and EOM are normal. Pupils are equal, round, and reactive to light.  Cardiovascular: Normal rate and regular rhythm.  Exam reveals no gallop and no friction rub.   No murmur heard. Pulses:      Radial pulses are 2+ on the right side, and 2+ on the left side.       Dorsalis pedis pulses are 2+ on the right side, and 2+ on the left side.  Pulmonary/Chest: Effort normal. No respiratory distress.  Neurological: He is alert and oriented to person, place, and time.  Skin: Skin is warm and dry. He is not diaphoretic.  Psychiatric: He has a normal mood and affect. His behavior is normal.     Assessment and Plan: Alejandro Middleton is a 56 y.o. male who is here today for cc of dot. He wishes to start  a bp medication today as he may not be able to obtain appt with pcp in time.   I am adding the amlodipine with his arb/diuretic combo.  He will check his bp twice per week.  He will return in 2 weeks.  Given dash diet.  After this, he can follow up with his pcp.   Essential hypertension - Plan: CMP14+EGFR  Ivar Drape, PA-C [facility] 4/4/201810:46 AM

## 2016-09-09 ENCOUNTER — Ambulatory Visit: Payer: Commercial Managed Care - HMO | Admitting: Physician Assistant

## 2016-09-21 ENCOUNTER — Other Ambulatory Visit: Payer: Self-pay | Admitting: Physician Assistant

## 2016-11-09 ENCOUNTER — Other Ambulatory Visit: Payer: Self-pay | Admitting: Physician Assistant

## 2017-01-22 ENCOUNTER — Other Ambulatory Visit: Payer: Self-pay | Admitting: Internal Medicine

## 2017-04-20 DIAGNOSIS — N453 Epididymo-orchitis: Secondary | ICD-10-CM | POA: Diagnosis not present

## 2017-04-20 DIAGNOSIS — R102 Pelvic and perineal pain: Secondary | ICD-10-CM | POA: Diagnosis not present

## 2017-07-26 DIAGNOSIS — N400 Enlarged prostate without lower urinary tract symptoms: Secondary | ICD-10-CM | POA: Diagnosis not present

## 2017-08-17 ENCOUNTER — Ambulatory Visit: Payer: Self-pay | Admitting: Surgery

## 2017-08-17 DIAGNOSIS — K4020 Bilateral inguinal hernia, without obstruction or gangrene, not specified as recurrent: Secondary | ICD-10-CM | POA: Diagnosis not present

## 2017-08-17 DIAGNOSIS — Z01818 Encounter for other preprocedural examination: Secondary | ICD-10-CM | POA: Diagnosis not present

## 2017-09-01 ENCOUNTER — Encounter: Payer: Self-pay | Admitting: Physician Assistant

## 2019-04-18 ENCOUNTER — Ambulatory Visit: Payer: Self-pay | Admitting: Surgery

## 2019-04-18 NOTE — H&P (View-Only) (Signed)
[name] Documented: 04/18/2019 11:00 AM Location: [facility] Patient #: [id number] DOB: [birth year] Single / Language: Alejandro Middleton / Race: Black or African American Male  History of Present Illness [name] MD; 04/18/2019 11:24 AM) The patient is a 58 year old male who presents with inguinal swelling. Note for "Inguinal swelling": ` ` ` Patient sent for surgical consultation at the request of Dr. Pilar Jarvis, [facility]  Chief Complaint: Worsening right inguinal hernia.   Patient returns. I saw him urine at ago. Definite riding hernia and possible left inguinal hernia. I offered surgery. He was trying to ride it out with his job. Unfortunately became more painful and he cannot continue is more intense activity with that job. He noticed that the right groin hernia has become larger. Concerned him. He wishes to reconsider surgery. He again is not a diabetic and does not smoke. His blood pressures been under control. He remains physically active. No skin infections. No fevers or chills.    PRIOR NOTE: The patient is a Pleasant male then noticed some swelling in his groin a few months ago. It gradually worsened. 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 He gets some swelling and burning with it. No severe sharp pain though. He usually moves his bowels every day. He has had numerous colonoscopies in the past. Most recent was negative. He can walk several miles without difficulty. He does not smoke. He is not a diabetic. He is just on a baby aspirin. No problems with urination or defecation. Some difficulty maintaining an erection due to discomfort. That was the main reason he saw urology when this probable inguinal hernia was diagnosed. Surgical consultation requested.  No personal nor family history of GI/colon cancer, inflammatory bowel disease, irritable bowel syndrome, allergy such as Celiac Sprue, dietary/dairy  problems, colitis, ulcers nor gastritis. No recent sick contacts/gastroenteritis. No travel outside the country. No changes in diet. No dysphagia to solids or liquids. No significant heartburn or reflux. No hematochezia, hematemesis, coffee ground emesis. No evidence of prior gastric/peptic ulceration.  (Review of systems as stated in this history (HPI) or in the review of systems. Otherwise all other 12 point ROS are negative) ` ` `   Allergies (Tanisha A. Owens Shark, [place]; 04/18/2019 11:00 AM) No Known Drug Allergies [08/17/2017]: Allergies Reconciled  Medication History (Tanisha A. Owens Shark, [place]; 04/18/2019 11:00 AM) AmLODIPine Besylate (5MG  Tablet, Oral) Active. Cialis (5MG  Tablet, Oral) Active. Medications Reconciled     Review of Systems [name], MD; 04/18/2019 11:17 AM) General Present- Night Sweats. Not Present- Appetite Loss, Chills, Fatigue, Fever, Weight Gain and Weight Loss. Skin Not Present- Change in Wart/Mole, Dryness, Hives, Jaundice, New Lesions, Non-Healing Wounds, Rash and Ulcer. HEENT Not Present- Earache, Hearing Loss, Hoarseness, Nose Bleed, Oral Ulcers, Ringing in the Ears, Seasonal Allergies, Sinus Pain, Sore Throat, Visual Disturbances, Wears glasses/contact lenses and Yellow Eyes. Breast Not Present- Breast Mass, Breast Pain, Nipple Discharge and Skin Changes. Cardiovascular Not Present- Chest Pain, Difficulty Breathing Lying Down, Leg Cramps, Palpitations, Rapid Heart Rate, Shortness of Breath and Swelling of Extremities. Gastrointestinal Not Present- Abdominal Pain, Bloating, Bloody Stool, Change in Bowel Habits, Chronic diarrhea, Constipation, Difficulty Swallowing, Excessive gas, Gets full quickly at meals, Hemorrhoids, Indigestion, Nausea, Rectal Pain and Vomiting. Male Genitourinary Not Present- Blood in Urine, Change in Urinary Stream, Frequency, Impotence, Nocturia, Painful Urination, Urgency and Urine Leakage.  Vitals (Tanisha A. Brown RMA;  04/18/2019 11:00 AM) 04/18/2019 11:00 AM Weight: 245.4 lb Height: 75in  Body Surface Area: 2.39 m Body Mass Index: 30.67 kg/m  Temp.: 97.33F  Pulse: 82 (Regular)  BP: 132/74 (Sitting, Left Arm, Standard)        Physical Exam [name] MD; 04/18/2019 11:25 AM)  General Mental Status-Alert. General Appearance-Not in acute distress, Not Sickly. Orientation-Oriented X3. Hydration-Well hydrated. Voice-Normal.  Integumentary Global Assessment Upon inspection and palpation of skin surfaces of the - Axillae: non-tender, no inflammation or ulceration, no drainage. and Distribution of scalp and body hair is normal. General Characteristics Temperature - normal warmth is noted.  Head and Neck Head-normocephalic, atraumatic with no lesions or palpable masses. Face Global Assessment - atraumatic, no absence of expression. Neck Global Assessment - no abnormal movements, no bruit auscultated on the right, no bruit auscultated on the left, no decreased range of motion, non-tender. Trachea-midline. Thyroid Gland Characteristics - non-tender.  Eye Eyeball - Left-Extraocular movements intact, No Nystagmus - Left. Eyeball - Right-Extraocular movements intact, No Nystagmus - Right. Cornea - Left-No Hazy - Left. Cornea - Right-No Hazy - Right. Sclera/Conjunctiva - Left-No scleral icterus, No Discharge - Left. Sclera/Conjunctiva - Right-No scleral icterus, No Discharge - Right. Pupil - Left-Direct reaction to light normal. Pupil - Right-Direct reaction to light normal.  ENMT Ears Pinna - Left - no drainage observed, no generalized tenderness observed. Pinna - Right - no drainage observed, no generalized tenderness observed. Nose and Sinuses External Inspection of the Nose - no destructive lesion observed. Inspection of the nares - Left - quiet respiration. Inspection of the nares - Right - quiet respiration. Mouth and Throat Lips - Upper  Lip - no fissures observed, no pallor noted. Lower Lip - no fissures observed, no pallor noted. Nasopharynx - no discharge present. Oral Cavity/Oropharynx - Tongue - no dryness observed. Oral Mucosa - no cyanosis observed. Hypopharynx - no evidence of airway distress observed.  Chest and Lung Exam Inspection Movements - Normal and Symmetrical. Accessory muscles - No use of accessory muscles in breathing. Palpation Palpation of the chest reveals - Non-tender. Auscultation Breath sounds - Normal and Clear.  Cardiovascular Auscultation Rhythm - Regular. Murmurs & Other Heart Sounds - Auscultation of the heart reveals - No Murmurs and No Systolic Clicks.  Abdomen Inspection Inspection of the abdomen reveals - No Visible peristalsis and No Abnormal pulsations. Umbilicus - No Bleeding, No Urine drainage. Palpation/Percussion Palpation and Percussion of the abdomen reveal - Soft, Non Tender, No Rebound tenderness, No Rigidity (guarding) and No Cutaneous hyperesthesia. Note: Abdomen soft. Nontender. Not distended. 5 mm umbilical hernia through the stalk only. Overweight with moderate diastases recti. No guarding.  Male Genitourinary Sexual Maturity Tanner 5 - Adult hair pattern and Adult penile size and shape. Note: ` ` ` Obvious large right inguinal hernia and groin starting to go down into his right scrotum. Sensitive but reducible. Left inguinal hernia rather medial consistent with direct space hernia. O/W normal external male genitalia circumcised with mild right testicular sensitivity. No epididymitis. No testicular masses.  Peripheral Vascular Upper Extremity Inspection - Left - No Cyanotic nailbeds - Left, Not Ischemic. Inspection - Right - No Cyanotic nailbeds - Right, Not Ischemic.  Neurologic Neurologic evaluation reveals -normal attention span and ability to concentrate, able to name objects and repeat phrases. Appropriate fund of knowledge , normal sensation and normal  coordination. Mental Status Affect - not angry, not paranoid. Cranial Nerves-Normal Bilaterally. Gait-Normal.  Neuropsychiatric Mental status exam performed with findings of-able to articulate well with normal speech/language, rate, volume and coherence, thought content normal  with ability to perform basic computations and apply abstract reasoning and no evidence of hallucinations, delusions, obsessions or homicidal/suicidal ideation.  Musculoskeletal Global Assessment Spine, Ribs and Pelvis - no instability, subluxation or laxity. Right Upper Extremity - no instability, subluxation or laxity.  Lymphatic Head & Neck General Head & Neck Lymphatics: Bilateral - Description - No Localized lymphadenopathy. Axillary General Axillary Region: Bilateral - Description - No Localized lymphadenopathy. Femoral & Inguinal Generalized Femoral & Inguinal Lymphatics: Left - Description - No Localized lymphadenopathy. Right - Description - No Localized lymphadenopathy.    Assessment & Plan [name] MD; 04/18/2019 11:26 AM)  BILATERAL INGUINAL HERNIA WITHOUT OBSTRUCTION OR GANGRENE, RECURRENCE NOT SPECIFIED (K40.20) Impression: Obvious bilateral inguinal hernias. Left side now out. Right-sided larger.  I think he would benefit from surgery. He is ready to reconsider surgery in hopes to get it done as soon as possible. I did caution in the middle of the Covid pandemic this may not happen right away, but we will see.   PREOP - ING HERNIA - ENCOUNTER FOR PREOPERATIVE EXAMINATION FOR GENERAL SURGICAL PROCEDURE (Z01.818)  Current Plans You are being scheduled for surgery- Our schedulers will call you.  You should hear from our office's scheduling department within 5 working days about the location, date, and time of surgery. We try to make accommodations for patient's preferences in scheduling surgery, but sometimes the OR schedule or the surgeon's schedule prevents [place] from making those  accommodations.  If you have not heard from our office [phone]) in 5 working days, call the office and ask for your surgeon's nurse.  If you have other questions about your diagnosis, plan, or surgery, call the office and ask for your surgeon's nurse.  Written instructions provided The anatomy & physiology of the abdominal wall and pelvic floor was discussed. The pathophysiology of hernias in the inguinal and pelvic region was discussed. Natural history risks such as progressive enlargement, pain, incarceration, and strangulation was discussed. Contributors to complications such as smoking, obesity, diabetes, prior surgery, etc were discussed.  I feel the risks of no intervention will lead to serious problems that outweigh the operative risks; therefore, I recommended surgery to reduce and repair the hernia. I explained laparoscopic techniques with possible need for an open approach. I noted usual use of mesh to patch and/or buttress hernia repair  Risks such as bleeding, infection, abscess, need for further treatment, heart attack, death, and other risks were discussed. I noted a good likelihood this will help address the problem. Goals of post-operative recovery were discussed as well. Possibility that this will not correct all symptoms was explained. I stressed the importance of low-impact activity, aggressive pain control, avoiding constipation, & not pushing through pain to minimize risk of post-operative chronic pain or injury. Possibility of reherniation was discussed. We will work to minimize complications.  An educational handout further explaining the pathology & treatment options was given as well. Questions were answered. The patient expresses understanding & wishes to proceed with surgery.  Pt Education - Pamphlet Given - Laparoscopic Hernia Repair: discussed with patient and provided information. Pt Education - [facility] Pain Control (Lucina Betty) Pt Education - [facility] Hernia  Post-Op HCI (Macklyn Glandon): discussed with patient and provided information. Pt Education - [facility] Mesh education: discussed with patient and provided information.  UMBILICAL HERNIA WITHOUT OBSTRUCTION AND WITHOUT GANGRENE (K42.9) Impression: Small but definite hernias or umbilical stalk. Would plan primary repair at the time surgery since I incision nearby anyway.  Current Plans The anatomy &  physiology of the abdominal wall was discussed. The pathophysiology of hernias was discussed. Natural history risks without surgery including progeressive enlargement, pain, incarceration, & strangulation was discussed. Contributors to complications such as smoking, obesity, diabetes, prior surgery, etc were discussed.  I feel the risks of no intervention will lead to serious problems that outweigh the operative risks; therefore, I recommended surgery to reduce and repair the hernia. I explained laparoscopic techniques with possible need for an open approach. I noted the probable use of mesh to patch and/or buttress the hernia repair  Risks such as bleeding, infection, abscess, need for further treatment, heart attack, death, and other risks were discussed. I noted a good likelihood this will help address the problem. Goals of post-operative recovery were discussed as well. Possibility that this will not correct all symptoms was explained. I stressed the importance of low-impact activity, aggressive pain control, avoiding constipation, & not pushing through pain to minimize risk of post-operative chronic pain or injury. Possibility of reherniation especially with smoking, obesity, diabetes, immunosuppression, and other health conditions was discussed. We will work to minimize complications.  An educational handout further explaining the pathology & treatment options was given as well. Questions were answered. The patient expresses understanding & wishes to proceed with surgery.  [name], MD, FACS,  [facility] Gastrointestinal and Minimally Invasive Surgery  [facility] [address], [place], [place] [address] [phone] Main / Paging [phone] Fax

## 2019-04-18 NOTE — H&P (Signed)
Alejandro Middleton Documented: 04/18/2019 11:00 AM Location: [facility] Patient #: [id number] DOB: 07/23/1960 Single / Language: Cleophus Molt / Race: Black or African American Male  History of Present Illness [name] MD; 04/18/2019 11:24 AM) The patient is a 58 year old male who presents with inguinal swelling. Note for "Inguinal swelling": ` ` ` Patient sent for surgical consultation at the request of Dr. Pilar Jarvis, [facility]  Chief Complaint: Worsening right inguinal hernia.   Patient returns. I saw him urine at ago. Definite riding hernia and possible left inguinal hernia. I offered surgery. He was trying to ride it out with his job. Unfortunately became more painful and he cannot continue is more intense activity with that job. He noticed that the right groin hernia has become larger. Concerned him. He wishes to reconsider surgery. He again is not a diabetic and does not smoke. His blood pressures been under control. He remains physically active. No skin infections. No fevers or chills.    PRIOR NOTE: The patient is a Pleasant male then noticed some swelling in his groin a few months ago. It gradually worsened. 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 He gets some swelling and burning with it. No severe sharp pain though. He usually moves his bowels every day. He has had numerous colonoscopies in the past. Most recent was negative. He can walk several miles without difficulty. He does not smoke. He is not a diabetic. He is just on a baby aspirin. No problems with urination or defecation. Some difficulty maintaining an erection due to discomfort. That was the main reason he saw urology when this probable inguinal hernia was diagnosed. Surgical consultation requested.  No personal nor family history of GI/colon cancer, inflammatory bowel disease, irritable bowel syndrome, allergy such as Celiac Sprue, dietary/dairy  problems, colitis, ulcers nor gastritis. No recent sick contacts/gastroenteritis. No travel outside the country. No changes in diet. No dysphagia to solids or liquids. No significant heartburn or reflux. No hematochezia, hematemesis, coffee ground emesis. No evidence of prior gastric/peptic ulceration.  (Review of systems as stated in this history (HPI) or in the review of systems. Otherwise all other 12 point ROS are negative) ` ` `   Allergies (Tanisha A. Owens Shark, [place]; 04/18/2019 11:00 AM) No Known Drug Allergies [08/17/2017]: Allergies Reconciled  Medication History (Tanisha A. Owens Shark, [place]; 04/18/2019 11:00 AM) AmLODIPine Besylate (5MG  Tablet, Oral) Active. Cialis (5MG  Tablet, Oral) Active. Medications Reconciled     Review of Systems [name], MD; 04/18/2019 11:17 AM) General Present- Night Sweats. Not Present- Appetite Loss, Chills, Fatigue, Fever, Weight Gain and Weight Loss. Skin Not Present- Change in Wart/Mole, Dryness, Hives, Jaundice, New Lesions, Non-Healing Wounds, Rash and Ulcer. HEENT Not Present- Earache, Hearing Loss, Hoarseness, Nose Bleed, Oral Ulcers, Ringing in the Ears, Seasonal Allergies, Sinus Pain, Sore Throat, Visual Disturbances, Wears glasses/contact lenses and Yellow Eyes. Breast Not Present- Breast Mass, Breast Pain, Nipple Discharge and Skin Changes. Cardiovascular Not Present- Chest Pain, Difficulty Breathing Lying Down, Leg Cramps, Palpitations, Rapid Heart Rate, Shortness of Breath and Swelling of Extremities. Gastrointestinal Not Present- Abdominal Pain, Bloating, Bloody Stool, Change in Bowel Habits, Chronic diarrhea, Constipation, Difficulty Swallowing, Excessive gas, Gets full quickly at meals, Hemorrhoids, Indigestion, Nausea, Rectal Pain and Vomiting. Male Genitourinary Not Present- Blood in Urine, Change in Urinary Stream, Frequency, Impotence, Nocturia, Painful Urination, Urgency and Urine Leakage.  Vitals (Tanisha A. Brown RMA;  04/18/2019 11:00 AM) 04/18/2019 11:00 AM Weight: 245.4 lb Height: 75in  Body Surface Area: 2.39 m Body Mass Index: 30.67 kg/m  Temp.: 97.1F  Pulse: 82 (Regular)  BP: 132/74 (Sitting, Left Arm, Standard)        Physical Exam [name] MD; 04/18/2019 11:25 AM)  General Mental Status-Alert. General Appearance-Not in acute distress, Not Sickly. Orientation-Oriented X3. Hydration-Well hydrated. Voice-Normal.  Integumentary Global Assessment Upon inspection and palpation of skin surfaces of the - Axillae: non-tender, no inflammation or ulceration, no drainage. and Distribution of scalp and body hair is normal. General Characteristics Temperature - normal warmth is noted.  Head and Neck Head-normocephalic, atraumatic with no lesions or palpable masses. Face Global Assessment - atraumatic, no absence of expression. Neck Global Assessment - no abnormal movements, no bruit auscultated on the right, no bruit auscultated on the left, no decreased range of motion, non-tender. Trachea-midline. Thyroid Gland Characteristics - non-tender.  Eye Eyeball - Left-Extraocular movements intact, No Nystagmus - Left. Eyeball - Right-Extraocular movements intact, No Nystagmus - Right. Cornea - Left-No Hazy - Left. Cornea - Right-No Hazy - Right. Sclera/Conjunctiva - Left-No scleral icterus, No Discharge - Left. Sclera/Conjunctiva - Right-No scleral icterus, No Discharge - Right. Pupil - Left-Direct reaction to light normal. Pupil - Right-Direct reaction to light normal.  ENMT Ears Pinna - Left - no drainage observed, no generalized tenderness observed. Pinna - Right - no drainage observed, no generalized tenderness observed. Nose and Sinuses External Inspection of the Nose - no destructive lesion observed. Inspection of the nares - Left - quiet respiration. Inspection of the nares - Right - quiet respiration. Mouth and Throat Lips - Upper  Lip - no fissures observed, no pallor noted. Lower Lip - no fissures observed, no pallor noted. Nasopharynx - no discharge present. Oral Cavity/Oropharynx - Tongue - no dryness observed. Oral Mucosa - no cyanosis observed. Hypopharynx - no evidence of airway distress observed.  Chest and Lung Exam Inspection Movements - Normal and Symmetrical. Accessory muscles - No use of accessory muscles in breathing. Palpation Palpation of the chest reveals - Non-tender. Auscultation Breath sounds - Normal and Clear.  Cardiovascular Auscultation Rhythm - Regular. Murmurs & Other Heart Sounds - Auscultation of the heart reveals - No Murmurs and No Systolic Clicks.  Abdomen Inspection Inspection of the abdomen reveals - No Visible peristalsis and No Abnormal pulsations. Umbilicus - No Bleeding, No Urine drainage. Palpation/Percussion Palpation and Percussion of the abdomen reveal - Soft, Non Tender, No Rebound tenderness, No Rigidity (guarding) and No Cutaneous hyperesthesia. Note: Abdomen soft. Nontender. Not distended. 5 mm umbilical hernia through the stalk only. Overweight with moderate diastases recti. No guarding.  Male Genitourinary Sexual Maturity Tanner 5 - Adult hair pattern and Adult penile size and shape. Note: ` ` ` Obvious large right inguinal hernia and groin starting to go down into his right scrotum. Sensitive but reducible. Left inguinal hernia rather medial consistent with direct space hernia. O/W normal external male genitalia circumcised with mild right testicular sensitivity. No epididymitis. No testicular masses.  Peripheral Vascular Upper Extremity Inspection - Left - No Cyanotic nailbeds - Left, Not Ischemic. Inspection - Right - No Cyanotic nailbeds - Right, Not Ischemic.  Neurologic Neurologic evaluation reveals -normal attention span and ability to concentrate, able to name objects and repeat phrases. Appropriate fund of knowledge , normal sensation and normal  coordination. Mental Status Affect - not angry, not paranoid. Cranial Nerves-Normal Bilaterally. Gait-Normal.  Neuropsychiatric Mental status exam performed with findings of-able to articulate well with normal speech/language, rate, volume and coherence, thought content normal  with ability to perform basic computations and apply abstract reasoning and no evidence of hallucinations, delusions, obsessions or homicidal/suicidal ideation.  Musculoskeletal Global Assessment Spine, Ribs and Pelvis - no instability, subluxation or laxity. Right Upper Extremity - no instability, subluxation or laxity.  Lymphatic Head & Neck General Head & Neck Lymphatics: Bilateral - Description - No Localized lymphadenopathy. Axillary General Axillary Region: Bilateral - Description - No Localized lymphadenopathy. Femoral & Inguinal Generalized Femoral & Inguinal Lymphatics: Left - Description - No Localized lymphadenopathy. Right - Description - No Localized lymphadenopathy.    Assessment & Plan [name] MD; 04/18/2019 11:26 AM)  BILATERAL INGUINAL HERNIA WITHOUT OBSTRUCTION OR GANGRENE, RECURRENCE NOT SPECIFIED (K40.20) Impression: Obvious bilateral inguinal hernias. Left side now out. Right-sided larger.  I think he would benefit from surgery. He is ready to reconsider surgery in hopes to get it done as soon as possible. I did caution in the middle of the Covid pandemic this may not happen right away, but we will see.   PREOP - ING HERNIA - ENCOUNTER FOR PREOPERATIVE EXAMINATION FOR GENERAL SURGICAL PROCEDURE (Z01.818)  Current Plans You are being scheduled for surgery- Our schedulers will call you.  You should hear from our office's scheduling department within 5 working days about the location, date, and time of surgery. We try to make accommodations for patient's preferences in scheduling surgery, but sometimes the OR schedule or the surgeon's schedule prevents [place] from making those  accommodations.  If you have not heard from our office [phone]) in 5 working days, call the office and ask for your surgeon's nurse.  If you have other questions about your diagnosis, plan, or surgery, call the office and ask for your surgeon's nurse.  Written instructions provided The anatomy & physiology of the abdominal wall and pelvic floor was discussed. The pathophysiology of hernias in the inguinal and pelvic region was discussed. Natural history risks such as progressive enlargement, pain, incarceration, and strangulation was discussed. Contributors to complications such as smoking, obesity, diabetes, prior surgery, etc were discussed.  I feel the risks of no intervention will lead to serious problems that outweigh the operative risks; therefore, I recommended surgery to reduce and repair the hernia. I explained laparoscopic techniques with possible need for an open approach. I noted usual use of mesh to patch and/or buttress hernia repair  Risks such as bleeding, infection, abscess, need for further treatment, heart attack, death, and other risks were discussed. I noted a good likelihood this will help address the problem. Goals of post-operative recovery were discussed as well. Possibility that this will not correct all symptoms was explained. I stressed the importance of low-impact activity, aggressive pain control, avoiding constipation, & not pushing through pain to minimize risk of post-operative chronic pain or injury. Possibility of reherniation was discussed. We will work to minimize complications.  An educational handout further explaining the pathology & treatment options was given as well. Questions were answered. The patient expresses understanding & wishes to proceed with surgery.  Pt Education - Pamphlet Given - Laparoscopic Hernia Repair: discussed with patient and provided information. Pt Education - CCS Pain Control (Milburn Freeney) Pt Education - CCS Hernia  Post-Op HCI (Christa Fasig): discussed with patient and provided information. Pt Education - CCS Mesh education: discussed with patient and provided information.  UMBILICAL HERNIA WITHOUT OBSTRUCTION AND WITHOUT GANGRENE (K42.9) Impression: Small but definite hernias or umbilical stalk. Would plan primary repair at the time surgery since I incision nearby anyway.  Current Plans The anatomy &  physiology of the abdominal wall was discussed. The pathophysiology of hernias was discussed. Natural history risks without surgery including progeressive enlargement, pain, incarceration, & strangulation was discussed. Contributors to complications such as smoking, obesity, diabetes, prior surgery, etc were discussed.  I feel the risks of no intervention will lead to serious problems that outweigh the operative risks; therefore, I recommended surgery to reduce and repair the hernia. I explained laparoscopic techniques with possible need for an open approach. I noted the probable use of mesh to patch and/or buttress the hernia repair  Risks such as bleeding, infection, abscess, need for further treatment, heart attack, death, and other risks were discussed. I noted a good likelihood this will help address the problem. Goals of post-operative recovery were discussed as well. Possibility that this will not correct all symptoms was explained. I stressed the importance of low-impact activity, aggressive pain control, avoiding constipation, & not pushing through pain to minimize risk of post-operative chronic pain or injury. Possibility of reherniation especially with smoking, obesity, diabetes, immunosuppression, and other health conditions was discussed. We will work to minimize complications.  An educational handout further explaining the pathology & treatment options was given as well. Questions were answered. The patient expresses understanding & wishes to proceed with surgery.  [name], MD, FACS,  [facility] Gastrointestinal and Minimally Invasive Surgery  [facility] [address] [place], [place] [address] [phone] Main / Paging [phone] Fax

## 2019-04-26 ENCOUNTER — Encounter (HOSPITAL_BASED_OUTPATIENT_CLINIC_OR_DEPARTMENT_OTHER): Payer: Self-pay | Admitting: *Deleted

## 2019-04-26 ENCOUNTER — Other Ambulatory Visit: Payer: Self-pay

## 2019-04-26 NOTE — Progress Notes (Signed)
Spoke w/ via phone for pre-op interview---Meet Lab needs dos----   I stat 8, ekg            Lab results------ COVID test ------04-28-2019 Arrive at -------1100 05-02-2019 NPO after ------midnight food, clear liquids from midnight until 700 am then npo Medications to take morning of surgery -----amlodipine, es tylenol Diabetic medication -----n/a Patient Special Instructions ----- Pre-Op special Istructions ----- Patient verbalized understanding of instructions that were given at this phone interview. Patient denies shortness of breath, chest pain, fever, cough a this phone interview.

## 2019-04-28 ENCOUNTER — Other Ambulatory Visit (HOSPITAL_COMMUNITY)
Admission: RE | Admit: 2019-04-28 | Discharge: 2019-04-28 | Disposition: A | Discharge status: Home / Self Care | Payer: 59 | Source: Ambulatory Visit | Attending: Surgery | Admitting: Surgery

## 2019-04-28 DIAGNOSIS — Z20828 Contact with and (suspected) exposure to other viral communicable diseases: Secondary | ICD-10-CM | POA: Diagnosis not present

## 2019-04-28 DIAGNOSIS — Z01812 Encounter for preprocedural laboratory examination: Secondary | ICD-10-CM | POA: Insufficient documentation

## 2019-04-29 LAB — NOVEL CORONAVIRUS, NAA (HOSP ORDER, SEND-OUT TO REF LAB; TAT 18-24 HRS): SARS-CoV-2, NAA: NOT DETECTED

## 2019-05-01 MED ORDER — BUPIVACAINE LIPOSOME 1.3 % IJ SUSP
20.0000 mL | Freq: Once | INTRAMUSCULAR | Status: DC
  Filled 2019-05-01 (×2): qty 20

## 2019-05-02 ENCOUNTER — Ambulatory Visit (HOSPITAL_BASED_OUTPATIENT_CLINIC_OR_DEPARTMENT_OTHER)
Admission: RE | Admit: 2019-05-02 | Discharge: 2019-05-02 | Disposition: A | Discharge status: Home / Self Care | Payer: 59 | Attending: Surgery | Admitting: Surgery

## 2019-05-02 ENCOUNTER — Other Ambulatory Visit: Payer: Self-pay

## 2019-05-02 ENCOUNTER — Encounter (HOSPITAL_BASED_OUTPATIENT_CLINIC_OR_DEPARTMENT_OTHER)
Admission: RE | Disposition: A | Discharge status: Home / Self Care | Payer: Self-pay | Source: Home / Self Care | Attending: Surgery

## 2019-05-02 ENCOUNTER — Ambulatory Visit (HOSPITAL_BASED_OUTPATIENT_CLINIC_OR_DEPARTMENT_OTHER): Payer: 59 | Admitting: Certified Registered"

## 2019-05-02 ENCOUNTER — Encounter (HOSPITAL_BASED_OUTPATIENT_CLINIC_OR_DEPARTMENT_OTHER): Payer: Self-pay | Admitting: Certified Registered"

## 2019-05-02 DIAGNOSIS — G4733 Obstructive sleep apnea (adult) (pediatric): Secondary | ICD-10-CM | POA: Insufficient documentation

## 2019-05-02 DIAGNOSIS — K4020 Bilateral inguinal hernia, without obstruction or gangrene, not specified as recurrent: Secondary | ICD-10-CM | POA: Diagnosis not present

## 2019-05-02 DIAGNOSIS — K429 Umbilical hernia without obstruction or gangrene: Secondary | ICD-10-CM | POA: Diagnosis not present

## 2019-05-02 DIAGNOSIS — I10 Essential (primary) hypertension: Secondary | ICD-10-CM | POA: Diagnosis present

## 2019-05-02 DIAGNOSIS — E785 Hyperlipidemia, unspecified: Secondary | ICD-10-CM | POA: Diagnosis not present

## 2019-05-02 DIAGNOSIS — E669 Obesity, unspecified: Secondary | ICD-10-CM | POA: Insufficient documentation

## 2019-05-02 DIAGNOSIS — Z6831 Body mass index (BMI) 31.0-31.9, adult: Secondary | ICD-10-CM | POA: Insufficient documentation

## 2019-05-02 DIAGNOSIS — Z7982 Long term (current) use of aspirin: Secondary | ICD-10-CM | POA: Insufficient documentation

## 2019-05-02 DIAGNOSIS — D176 Benign lipomatous neoplasm of spermatic cord: Secondary | ICD-10-CM | POA: Insufficient documentation

## 2019-05-02 DIAGNOSIS — Z79899 Other long term (current) drug therapy: Secondary | ICD-10-CM | POA: Diagnosis not present

## 2019-05-02 HISTORY — DX: Unilateral inguinal hernia, without obstruction or gangrene, not specified as recurrent: K40.90

## 2019-05-02 HISTORY — PX: INGUINAL HERNIA REPAIR: SHX194

## 2019-05-02 LAB — POCT I-STAT, CHEM 8
BUN: 18 mg/dL (ref 6–20)
Calcium, Ion: 1.23 mmol/L (ref 1.15–1.40)
Chloride: 102 mmol/L (ref 98–111)
Creatinine, Ser: 1.5 mg/dL — ABNORMAL HIGH (ref 0.61–1.24)
Glucose, Bld: 94 mg/dL (ref 70–99)
HCT: 51 % (ref 39.0–52.0)
Hemoglobin: 17.3 g/dL — ABNORMAL HIGH (ref 13.0–17.0)
Potassium: 5.4 mmol/L — ABNORMAL HIGH (ref 3.5–5.1)
Sodium: 141 mmol/L (ref 135–145)
TCO2: 30 mmol/L (ref 22–32)

## 2019-05-02 SURGERY — REPAIR, HERNIA, INGUINAL, BILATERAL, LAPAROSCOPIC
Anesthesia: General | Site: Abdomen

## 2019-05-02 MED ORDER — LIDOCAINE 2% (20 MG/ML) 5 ML SYRINGE
INTRAMUSCULAR | Status: AC
  Filled 2019-05-02: qty 5

## 2019-05-02 MED ORDER — LIDOCAINE 2% (20 MG/ML) 5 ML SYRINGE
INTRAMUSCULAR | Status: DC | PRN
  Administered 2019-05-02: 100 mg via INTRAVENOUS

## 2019-05-02 MED ORDER — EPHEDRINE SULFATE-NACL 50-0.9 MG/10ML-% IV SOSY
PREFILLED_SYRINGE | INTRAVENOUS | Status: DC | PRN
  Administered 2019-05-02: 10 mg via INTRAVENOUS

## 2019-05-02 MED ORDER — LOSARTAN POTASSIUM-HCTZ 100-12.5 MG PO TABS
1.0000 | ORAL_TABLET | Freq: Every day | ORAL | Status: DC
  Filled 2019-05-02: qty 1

## 2019-05-02 MED ORDER — MIDAZOLAM HCL 2 MG/2ML IJ SOLN
INTRAMUSCULAR | Status: DC | PRN
  Administered 2019-05-02: 2 mg via INTRAVENOUS

## 2019-05-02 MED ORDER — SUGAMMADEX SODIUM 200 MG/2ML IV SOLN
INTRAVENOUS | Status: DC | PRN
  Administered 2019-05-02: 220 mg via INTRAVENOUS

## 2019-05-02 MED ORDER — FENTANYL CITRATE (PF) 100 MCG/2ML IJ SOLN
25.0000 ug | INTRAMUSCULAR | Status: DC | PRN
  Filled 2019-05-02: qty 1

## 2019-05-02 MED ORDER — FENTANYL CITRATE (PF) 100 MCG/2ML IJ SOLN
INTRAMUSCULAR | Status: DC | PRN
  Administered 2019-05-02: 50 ug via INTRAVENOUS
  Administered 2019-05-02: 100 ug via INTRAVENOUS
  Administered 2019-05-02: 50 ug via INTRAVENOUS

## 2019-05-02 MED ORDER — GABAPENTIN 300 MG PO CAPS
300.0000 mg | ORAL_CAPSULE | ORAL | Status: AC
  Administered 2019-05-02: 300 mg via ORAL
  Filled 2019-05-02: qty 1

## 2019-05-02 MED ORDER — ONDANSETRON HCL 4 MG/2ML IJ SOLN
INTRAMUSCULAR | Status: DC | PRN
  Administered 2019-05-02: 4 mg via INTRAVENOUS

## 2019-05-02 MED ORDER — MIDAZOLAM HCL 2 MG/2ML IJ SOLN
INTRAMUSCULAR | Status: AC
  Filled 2019-05-02: qty 2

## 2019-05-02 MED ORDER — LACTATED RINGERS IV SOLN
INTRAVENOUS | Status: DC
  Administered 2019-05-02 (×2): via INTRAVENOUS
  Filled 2019-05-02: qty 1000

## 2019-05-02 MED ORDER — ENALAPRILAT 1.25 MG/ML IV SOLN
0.6250 mg | Freq: Four times a day (QID) | INTRAVENOUS | Status: DC | PRN
  Filled 2019-05-02: qty 1

## 2019-05-02 MED ORDER — CEFAZOLIN SODIUM-DEXTROSE 2-4 GM/100ML-% IV SOLN
INTRAVENOUS | Status: AC
  Filled 2019-05-02: qty 100

## 2019-05-02 MED ORDER — FENTANYL CITRATE (PF) 100 MCG/2ML IJ SOLN
INTRAMUSCULAR | Status: AC
  Filled 2019-05-02: qty 2

## 2019-05-02 MED ORDER — ACETAMINOPHEN 500 MG PO TABS
1000.0000 mg | ORAL_TABLET | ORAL | Status: AC
  Administered 2019-05-02: 1000 mg via ORAL
  Filled 2019-05-02: qty 2

## 2019-05-02 MED ORDER — DEXAMETHASONE SODIUM PHOSPHATE 10 MG/ML IJ SOLN
INTRAMUSCULAR | Status: DC | PRN
  Administered 2019-05-02: 5 mg via INTRAVENOUS

## 2019-05-02 MED ORDER — ROCURONIUM BROMIDE 10 MG/ML (PF) SYRINGE
PREFILLED_SYRINGE | INTRAVENOUS | Status: AC
  Filled 2019-05-02: qty 10

## 2019-05-02 MED ORDER — CHLORHEXIDINE GLUCONATE CLOTH 2 % EX PADS
6.0000 | MEDICATED_PAD | Freq: Once | CUTANEOUS | Status: DC
  Filled 2019-05-02: qty 6

## 2019-05-02 MED ORDER — TRAMADOL HCL 50 MG PO TABS: 50.0000 mg | ORAL_TABLET | Freq: Four times a day (QID) | ORAL | 0 refills | Status: DC | PRN

## 2019-05-02 MED ORDER — LABETALOL HCL 5 MG/ML IV SOLN
5.0000 mg | INTRAVENOUS | Status: AC | PRN
  Administered 2019-05-02 (×2): 5 mg via INTRAVENOUS
  Filled 2019-05-02: qty 4

## 2019-05-02 MED ORDER — HYDRALAZINE HCL 20 MG/ML IJ SOLN
INTRAMUSCULAR | Status: AC
  Filled 2019-05-02: qty 1

## 2019-05-02 MED ORDER — GABAPENTIN 300 MG PO CAPS
ORAL_CAPSULE | ORAL | Status: AC
  Filled 2019-05-02: qty 1

## 2019-05-02 MED ORDER — DEXAMETHASONE SODIUM PHOSPHATE 10 MG/ML IJ SOLN
INTRAMUSCULAR | Status: AC
  Filled 2019-05-02: qty 1

## 2019-05-02 MED ORDER — ROCURONIUM BROMIDE 10 MG/ML (PF) SYRINGE
PREFILLED_SYRINGE | INTRAVENOUS | Status: DC | PRN
  Administered 2019-05-02: 100 mg via INTRAVENOUS
  Administered 2019-05-02: 10 mg via INTRAVENOUS

## 2019-05-02 MED ORDER — LABETALOL HCL 5 MG/ML IV SOLN
INTRAVENOUS | Status: DC | PRN
  Administered 2019-05-02 (×4): 5 mg via INTRAVENOUS

## 2019-05-02 MED ORDER — ACETAMINOPHEN 500 MG PO TABS
ORAL_TABLET | ORAL | Status: AC
  Filled 2019-05-02: qty 2

## 2019-05-02 MED ORDER — EPHEDRINE 5 MG/ML INJ
INTRAVENOUS | Status: AC
  Filled 2019-05-02: qty 10

## 2019-05-02 MED ORDER — ONDANSETRON HCL 4 MG/2ML IJ SOLN
INTRAMUSCULAR | Status: AC
  Filled 2019-05-02: qty 2

## 2019-05-02 MED ORDER — LABETALOL HCL 5 MG/ML IV SOLN
INTRAVENOUS | Status: AC
  Filled 2019-05-02: qty 4

## 2019-05-02 MED ORDER — CEFAZOLIN SODIUM-DEXTROSE 2-4 GM/100ML-% IV SOLN
2.0000 g | INTRAVENOUS | Status: DC
  Filled 2019-05-02: qty 100

## 2019-05-02 MED ORDER — METOPROLOL TARTRATE 5 MG/5ML IV SOLN
5.0000 mg | Freq: Four times a day (QID) | INTRAVENOUS | Status: DC | PRN
  Filled 2019-05-02: qty 5

## 2019-05-02 MED ORDER — GABAPENTIN 300 MG PO CAPS: 300.0000 mg | ORAL_CAPSULE | Freq: Two times a day (BID) | ORAL | 1 refills | Status: DC

## 2019-05-02 MED ORDER — AMLODIPINE BESYLATE 5 MG PO TABS
5.0000 mg | ORAL_TABLET | Freq: Every day | ORAL | Status: DC
  Filled 2019-05-02: qty 1

## 2019-05-02 MED ORDER — BUPIVACAINE-EPINEPHRINE 0.25% -1:200000 IJ SOLN
INTRAMUSCULAR | Status: DC | PRN
  Administered 2019-05-02: 60 mL

## 2019-05-02 MED ORDER — BUPIVACAINE LIPOSOME 1.3 % IJ SUSP
INTRAMUSCULAR | Status: DC | PRN
  Administered 2019-05-02: 20 mL

## 2019-05-02 MED ORDER — CELECOXIB 200 MG PO CAPS
ORAL_CAPSULE | ORAL | Status: AC
  Filled 2019-05-02: qty 1

## 2019-05-02 MED ORDER — PROPOFOL 10 MG/ML IV BOLUS
INTRAVENOUS | Status: DC | PRN
  Administered 2019-05-02: 100 mg via INTRAVENOUS

## 2019-05-02 MED ORDER — HYDRALAZINE HCL 20 MG/ML IJ SOLN
10.0000 mg | Freq: Once | INTRAMUSCULAR | Status: AC
  Administered 2019-05-02: 10 mg via INTRAVENOUS
  Filled 2019-05-02: qty 0.5

## 2019-05-02 MED ORDER — CELECOXIB 200 MG PO CAPS
200.0000 mg | ORAL_CAPSULE | ORAL | Status: AC
  Administered 2019-05-02: 11:00:00 200 mg via ORAL
  Filled 2019-05-02: qty 1

## 2019-05-02 SURGICAL SUPPLY — 50 items
APPLICATOR COTTON TIP 6 STRL (MISCELLANEOUS) ×2 IMPLANT
APPLICATOR COTTON TIP 6IN STRL (MISCELLANEOUS) ×4
BLADE SURG 11 STRL SS (BLADE) ×2 IMPLANT
CABLE HIGH FREQUENCY MONO STRZ (ELECTRODE) ×2 IMPLANT
CANISTER SUCT 3000ML PPV (MISCELLANEOUS) IMPLANT
CHLORAPREP W/TINT 26 (MISCELLANEOUS) ×2 IMPLANT
CONT SPEC 4OZ CLIKSEAL STRL BL (MISCELLANEOUS) ×2 IMPLANT
COVER WAND RF STERILE (DRAPES) ×2 IMPLANT
DECANTER SPIKE VIAL GLASS SM (MISCELLANEOUS) ×2 IMPLANT
DERMABOND ADVANCED (GAUZE/BANDAGES/DRESSINGS)
DERMABOND ADVANCED .7 DNX12 (GAUZE/BANDAGES/DRESSINGS) IMPLANT
DEVICE SECURE STRAP 25 ABSORB (INSTRUMENTS) IMPLANT
DEVICE TROCAR PUNCTURE CLOSURE (ENDOMECHANICALS) ×2 IMPLANT
DRAPE LAPAROSCOPIC ABDOMINAL (DRAPES) ×2 IMPLANT
DRAPE UTILITY XL STRL (DRAPES) ×2 IMPLANT
DRAPE WARM FLUID 44X44 (DRAPES) ×2 IMPLANT
DRSG TEGADERM 2-3/8X2-3/4 SM (GAUZE/BANDAGES/DRESSINGS) ×2 IMPLANT
DRSG TEGADERM 4X4.75 (GAUZE/BANDAGES/DRESSINGS) ×2 IMPLANT
ELECT REM PT RETURN 9FT ADLT (ELECTROSURGICAL) ×2
ELECTRODE REM PT RTRN 9FT ADLT (ELECTROSURGICAL) ×1 IMPLANT
GLOVE ECLIPSE 8.0 STRL XLNG CF (GLOVE) ×2 IMPLANT
GLOVE INDICATOR 8.0 STRL GRN (GLOVE) ×2 IMPLANT
GOWN STRL REUS W/TWL XL LVL3 (GOWN DISPOSABLE) ×2 IMPLANT
IRRIG SUCT STRYKERFLOW 2 WTIP (MISCELLANEOUS)
IRRIGATION SUCT STRKRFLW 2 WTP (MISCELLANEOUS) IMPLANT
KIT TURNOVER CYSTO (KITS) ×2 IMPLANT
MANIFOLD NEPTUNE II (INSTRUMENTS) ×2 IMPLANT
MESH HERNIA 6X6 BARD (Mesh General) ×3 IMPLANT
MESH HERNIA BARD 6X6 (Mesh General) ×3 IMPLANT
NEEDLE HYPO 22GX1.5 SAFETY (NEEDLE) ×4 IMPLANT
NEEDLE HYPO 25X1 1.5 SAFETY (NEEDLE) ×2 IMPLANT
NS IRRIG 500ML POUR BTL (IV SOLUTION) ×2 IMPLANT
PACK BASIN DAY SURGERY FS (CUSTOM PROCEDURE TRAY) ×2 IMPLANT
PAD POSITIONING PINK XL (MISCELLANEOUS) ×2 IMPLANT
SCISSORS LAP 5X35 DISP (ENDOMECHANICALS) ×4 IMPLANT
SET TUBE SMOKE EVAC HIGH FLOW (TUBING) ×2 IMPLANT
SLEEVE ADV FIXATION 5X100MM (TROCAR) ×2 IMPLANT
SPONGE GAUZE 2X2 8PLY STRL LF (GAUZE/BANDAGES/DRESSINGS) ×2 IMPLANT
SUT MNCRL AB 4-0 PS2 18 (SUTURE) ×2 IMPLANT
SUT PDS AB 1 CT1 27 (SUTURE) ×6 IMPLANT
SUT VIC AB 2-0 SH 27 (SUTURE) ×3
SUT VIC AB 2-0 SH 27XBRD (SUTURE) ×3 IMPLANT
SUT VICRYL 0 UR6 27IN ABS (SUTURE) ×2 IMPLANT
SYR 20ML LL LF (SYRINGE) ×4 IMPLANT
TOWEL OR 17X26 10 PK STRL BLUE (TOWEL DISPOSABLE) ×2 IMPLANT
TRAY DSU PREP LF (CUSTOM PROCEDURE TRAY) ×2 IMPLANT
TRAY LAPAROSCOPIC (CUSTOM PROCEDURE TRAY) ×2 IMPLANT
TROCAR ADV FIXATION 5X100MM (TROCAR) ×2 IMPLANT
TROCAR XCEL BLUNT TIP 100MML (ENDOMECHANICALS) ×2 IMPLANT
WATER STERILE IRR 500ML POUR (IV SOLUTION) ×2 IMPLANT

## 2019-05-02 NOTE — Transfer of Care (Signed)
Immediate Anesthesia Transfer of Care Note  Patient: Alejandro Middleton  Procedure(s) Performed: Procedure(s) (LRB): LAPAROSCOPIC BILATERAL INGUINAL HERNIA REPAIR WITH MESH, PRIMARY UMBILICAL HERNIA REPAIR (N/A)  Patient Location: PACU  Anesthesia Type: General  Level of Consciousness: awake, oriented, sedated and patient cooperative  Airway & Oxygen Therapy: Patient Spontanous Breathing and Patient connected to face mask oxygen  Post-op Assessment: Report given to PACU RN and Post -op Vital signs reviewed and stable  Post vital signs: Reviewed and stable  Complications: No apparent anesthesia complications  Last Vitals:  Vitals Value Taken Time  BP 144/102 05/02/19 1515  Temp    Pulse 69 05/02/19 1517  Resp 26 05/02/19 1517  SpO2 88 % 05/02/19 1517  Vitals shown include unvalidated device data.  Last Pain: There were no vitals filed for this visit.

## 2019-05-02 NOTE — Progress Notes (Signed)
BP 179/123 left arm sitting. BP 176/116 right arm sitting. Dr. Lanetta Inch aware. No new orders.

## 2019-05-02 NOTE — Anesthesia Preprocedure Evaluation (Addendum)
Anesthesia Evaluation  Patient identified by MRN, date of birth, ID band Patient awake    Reviewed: Allergy & Precautions, NPO status , Patient's Chart, lab work & pertinent test results  Airway Mallampati: II  TM Distance: >3 FB Neck ROM: Full    Dental no notable dental hx. (+) Teeth Intact, Dental Advisory Given   Pulmonary sleep apnea ,    Pulmonary exam normal breath sounds clear to auscultation       Cardiovascular hypertension, Pt. on medications negative cardio ROS Normal cardiovascular exam Rhythm:Regular Rate:Normal     Neuro/Psych negative neurological ROS  negative psych ROS   GI/Hepatic negative GI ROS, Neg liver ROS,   Endo/Other  negative endocrine ROS  Renal/GU negative Renal ROS  negative genitourinary   Musculoskeletal negative musculoskeletal ROS (+)   Abdominal   Peds  Hematology negative hematology ROS (+)   Anesthesia Other Findings   Reproductive/Obstetrics                           Anesthesia Physical Anesthesia Plan  ASA: III  Anesthesia Plan: General   Post-op Pain Management:    Induction: Intravenous  PONV Risk Score and Plan: 2 and Midazolam, Dexamethasone and Ondansetron  Airway Management Planned: Oral ETT  Additional Equipment:   Intra-op Plan:   Post-operative Plan: Extubation in OR  Informed Consent: I have reviewed the patients History and Physical, chart, labs and discussed the procedure including the risks, benefits and alternatives for the proposed anesthesia with the patient or authorized representative who has indicated his/her understanding and acceptance.     Dental advisory given  Plan Discussed with: CRNA  Anesthesia Plan Comments:         Anesthesia Quick Evaluation

## 2019-05-02 NOTE — Anesthesia Procedure Notes (Addendum)
Procedure Name: Intubation Date/Time: 05/02/2019 12:50 PM Performed by: Suan Halter, CRNA Pre-anesthesia Checklist: Patient identified, Emergency Drugs available, Suction available and Patient being monitored Patient Re-evaluated:Patient Re-evaluated prior to induction Oxygen Delivery Method: Circle system utilized Preoxygenation: Pre-oxygenation with 100% oxygen Induction Type: IV induction Ventilation: Mask ventilation without difficulty Laryngoscope Size: Mac and 4 Grade View: Grade I Tube type: Oral Tube size: 7.5 mm Number of attempts: 1 Airway Equipment and Method: Stylet and Oral airway Placement Confirmation: ETT inserted through vocal cords under direct vision,  positive ETCO2 and breath sounds checked- equal and bilateral Secured at: 22 cm Tube secured with: Tape Dental Injury: Teeth and Oropharynx as per pre-operative assessment

## 2019-05-02 NOTE — Interval H&P Note (Signed)
History and Physical Interval Note:  05/02/2019 11:59 AM  Alejandro Middleton  has presented today for surgery, with the diagnosis of BILATERAL INGUINAL NEW HERNIAS, UMBILICAL HERNIA.  The various methods of treatment have been discussed with the patient and family. After consideration of risks, benefits and other options for treatment, the patient has consented to  Procedure(s): Alejandro Middleton, UMBILICAL HERNIA REPAIR WITH MESH (N/A) as a surgical intervention.  The patient's history has been reviewed, patient examined, no change in status, stable for surgery.  I have reviewed the patient's chart and labs.  Questions were answered to the patient's satisfaction.    I have re-reviewed the the patient's records, history, medications, and allergies.  I have re-examined the patient.  I again discussed intraoperative plans and goals of post-operative recovery.  The patient agrees to proceed.  Alejandro Middleton  12/06/1960 [id number]  Patient Care Team: Biagio Borg, MD as PCP - General  Patient Active Problem List   Diagnosis Date Noted  . Obesity 04/28/2012  . Alejandro Middleton hematuria 01/14/2012  . History of renal stone 01/14/2012  . Encounter for well adult exam with abnormal findings 12/12/2010  . Hyperlipidemia 06/19/2009  . SLEEP APNEA, OBSTRUCTIVE 06/19/2009  . Essential hypertension 06/19/2009  . 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 Erectile dysfunction   . History of renal stone 01/14/2012  . Hyperlipidemia   . Hypertension   . Inguinal hernia    bilateral  . OSA (obstructive sleep apnea)    s/p surgery    Past Surgical History:  Procedure Laterality Date  . s/p deviated nasal septum  2001  . s/p uvulopaltoplasty  2001  . VASECTOMY     Dr. Janice Norrie    Social History   Socioeconomic History  . Marital status: Single    Spouse name: Not on file  . Number of children: 1  . Years of education: Not  on file  . Highest education level: Not on file  Occupational History  . Occupation: retired    Fish farm manager: UNEMPLOYED    Comment: truck Geophysicist/field seismologist for recycling  . 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 Financial resource strain: Not on file  . Food insecurity    Worry: Not on file    Inability: Not on file  . Transportation needs    Medical: Not on file    Non-medical: Not on file  Tobacco Use  . Smoking status: Never Smoker  . Smokeless tobacco: Never Used  . Tobacco comment: high school only  Substance and Sexual Activity  . Alcohol use: Not Currently  . Drug use: No  . Sexual activity: Not on file  Lifestyle  . Physical activity    Days per week: Not on file    Minutes per session: Not on file  . Stress: Not on file  Relationships  . Social Herbalist on phone: Not on file    Gets together: Not on file    Attends religious service: Not on file    Active member of club or organization: Not on file    Attends meetings of clubs or organizations: Not on file    Relationship status: Not on file  . Intimate partner violence    Fear of current or ex partner: Not on file    Emotionally abused: Not on file    Physically abused: Not  on file    Forced sexual activity: Not on file  Other Topics Concern  . Not on file  Social History Narrative   Divorced/girlfriend    Family History  Problem Relation Age of Onset  . Cancer Sister        breast  . Hypertension Mother   . Glaucoma Mother   . Stroke Mother 18  . Heart attack Father 35    Medications Prior to Admission  Medication Sig Dispense Refill Last Dose  . acetaminophen (TYLENOL) 500 MG tablet Take 1,000 mg by mouth every 6 (six) hours as needed.   Past Week at Unknown time  . amLODipine (NORVASC) 5 MG tablet TAKE 1 TABLET(5 MG) BY MOUTH DAILY 90 tablet 1 Past Week at Unknown time  . aspirin EC 81 MG tablet Take 1 tablet (81 mg total) by mouth daily. 90 tablet 3  05/01/2019 at Unknown time  . ibuprofen (ADVIL) 400 MG tablet Take 400 mg by mouth every 6 (six) hours as needed.   Past Week at Unknown time  . losartan-hydrochlorothiazide (HYZAAR) 100-12.5 MG tablet Take 1 tablet by mouth daily. 90 tablet 3 Past Week at Unknown time  . rosuvastatin (CRESTOR) 40 MG tablet Take 1 tablet (40 mg total) by mouth daily. (Patient taking differently: Take 40 mg by mouth daily. Patient takes sometimes) 90 tablet 3 Past Week at Unknown time    Current Facility-Administered Medications  Medication Dose Route Frequency Provider Last Rate Last Dose  . bupivacaine liposome (EXPAREL) 1.3 % injection 266 mg  20 mL Infiltration Once Michael Boston, MD      . ceFAZolin (ANCEF) IVPB 2g/100 mL premix  2 g Intravenous On Call to OR Michael Boston, MD      . Chlorhexidine Gluconate Cloth 2 % PADS 6 each  6 each Topical Once Michael Boston, MD       And  . Chlorhexidine Gluconate Cloth 2 % PADS 6 each  6 each Topical Once Michael Boston, MD      . lactated ringers infusion   Intravenous Continuous Lidia Collum, MD 50 mL/hr at 05/02/19 1155       No Known Allergies  BP (!) 213/113   Pulse 87   Temp 99.6 F (37.6 C)   Resp 18   Ht 6\' 2"  (1.88 m)   Wt 109.8 kg   SpO2 100%   BMI 31.09 kg/m   Labs: Results for orders placed or performed during the hospital encounter of 05/02/19 (from the past 48 hour(s))  I-STAT, chem 8     Status: Abnormal   Collection Time: 05/02/19 11:23 AM  Result Value Ref Range   Sodium 141 135 - 145 mmol/L   Potassium 5.4 (H) 3.5 - 5.1 mmol/L   Chloride 102 98 - 111 mmol/L   BUN 18 6 - 20 mg/dL   Creatinine, Ser 1.50 (H) 0.61 - 1.24 mg/dL   Glucose, Bld 94 70 - 99 mg/dL   Calcium, Ion 1.23 1.15 - 1.40 mmol/L   TCO2 30 22 - 32 mmol/L   Hemoglobin 17.3 (H) 13.0 - 17.0 g/dL   HCT 51.0 39.0 - 52.0 %    Imaging / Studies: No results found.   [name], M.D., F.A.C.S. Gastrointestinal and Minimally Invasive Surgery [facility] [address] [place], [place] [address] [phone] Main / Paging  05/02/2019 11:59 AM    [name]

## 2019-05-02 NOTE — Op Note (Signed)
05/02/2019  3:03 PM  PATIENT:  Alejandro Middleton  58 y.o. male  Patient Care Team: Biagio Borg, MD as PCP - Huston Foley, MD as Consulting Physician (General Surgery)  PRE-OPERATIVE DIAGNOSIS:  BILATERAL INGUINAL NEW HERNIAS, UMBILICAL HERNIA  POST-OPERATIVE DIAGNOSIS:  BILATERAL INGUINAL NEW HERNIAS, UMBILICAL HERNIA  PROCEDURE:   LAPAROSCOPIC BILATERAL INGUINAL HERNIA REPAIR WITH MESH PRIMARY UMBILICAL HERNIA REPAIR TAP BLOCK - BILATERAL   SURGEON:  [name], MD  ASSISTANT: None  ANESTHESIA:     Regional ilioinguinal and genitofemoral and spermatic cord nerve blocks  General  Nerve block provided with liposomal bupivacaine (Experel) mixed with 0.25% bupivacaine as a Bilateral TAP block x 22mL each side at the level of the transverse abdominis & preperitoneal spaces along the flank at the anterior axillary line, from subcostal ridge to iliac crest under laparoscopic guidance    EBL:  Total I/O In: -  Out: 20 [Blood:20].  See anesthesia record  Delay start of Pharmacological VTE agent (>24hrs) due to surgical blood loss or risk of bleeding:  no  DRAINS: NONE  SPECIMEN: Hernia sacs (not sent)   DISPOSITION OF SPECIMEN:  N/A  COUNTS:  YES  PLAN OF CARE: Discharge to home after PACU  PATIENT DISPOSITION:  PACU - hemodynamically stable.  INDICATION: Pleasant gentleman with obvious inguinal hernia.  Suggested repair.  He try to hold off but became worse and now has bilateral hernias.  Small medical hernia as well.  I recommended laparoscopic exploration and repair of hernias found  The anatomy & physiology of the abdominal wall and pelvic floor was discussed.  The pathophysiology of hernias in the inguinal and pelvic region was discussed.  Natural history risks such as progressive enlargement, pain, incarceration & strangulation was discussed.   Contributors to complications such as smoking, obesity, diabetes, prior surgery, etc were discussed.    I feel  the risks of no intervention will lead to serious problems that outweigh the operative risks; therefore, I recommended surgery to reduce and repair the hernia.  I explained laparoscopic techniques with possible need for an open approach.  I noted usual use of mesh to patch and/or buttress hernia repair  Risks such as bleeding, infection, abscess, need for further treatment, heart attack, death, and other risks were discussed.  I noted a good likelihood this will help address the problem.   Goals of post-operative recovery were discussed as well.  Possibility that this will not correct all symptoms was explained.  I stressed the importance of low-impact activity, aggressive pain control, avoiding constipation, & not pushing through pain to minimize risk of post-operative chronic pain or injury. Possibility of reherniation was discussed.  We will work to minimize complications.     An educational handout further explaining the pathology & treatment options was given as well.  Questions were answered.  The patient expresses understanding & wishes to proceed with surgery.  OR FINDINGS: Right moderate-sized indirect inguinal hernia with large spermatic cord lipomas within it.  Removed.  Mild direct space laxity.  No femoral or obturator hernias.  On the left side direct space and indirect inguinal hernias pantaloon type.  No femoral nor obturator hernias.  12 mm hernia within umbilical stalk.  Primarily repaired  DESCRIPTION:  The patient was identified & brought into the operating room. The patient was positioned supine with arms tucked. SCDs were active during the entire case. The patient underwent general anesthesia without any difficulty.  The abdomen was prepped and draped in  a sterile fashion. The patient's bladder was emptied.  A Surgical Timeout confirmed our plan.  I made a transverse incision through the inferior umbilical fold.  I made a small transverse nick through the anterior rectus fascia  contralateral to the inguinal hernia side and placed a 0-vicryl stitch through the fascia.  I placed a Hasson trocar into the preperitoneal plane.  Entry was clean.  We induced carbon dioxide insufflation. Camera inspection revealed no injury.  I used a 83mm angled scope to bluntly free the peritoneum off the infraumbilical anterior abdominal wall.  I created enough of a preperitoneal pocket to place 73mm ports into the right & left mid-abdomen into this preperitoneal cavity.  I focused attention on the LEFT pelvis.   I used blunt & focused sharp dissection to free the peritoneum off the flank and down to the pubic rim.  I freed the anteriolateral bladder wall off the anteriolateral pelvic wall, sparing midline attachments.   I located a swath of peritoneum going into a hernia fascial defect at the  internal ring consistent with  an indirect inguinal hernia..  I gradually freed the peritoneal hernia sac off safely and reduced it into the preperitoneal space.  I freed the peritoneum off the spermatic vessels & vas deferens.  This revealed a direct space hernia as well.  Patient had moderate spermatic cord lipomas and fatty hernia sac.  These were freed off and separated.  I freed peritoneum off the retroperitoneum along the psoas muscle.  Spermatic cord lipoma was dissected away & removed.  I checked & assured hemostasis.     I turned attention on the opposite  RIGHT pelvis.  I did dissection in a similar, mirror-image fashion. The patient had an indirect inguinal hernia..   Much larger hernia sac excised.  Spermatic cord lipoma was dissected away & removed.    I checked & assured hemostasis.  As anticipated, surgical dissection was challenged by dense adhesions and poor planes resulting in the need for careful repair of the resulting peritoneal hernia sac defect defects.  Repair was done with minimally invasive intracorporeal suturing using absorbable 2-0 Vicryl running suture on the right and even left sides.    I chose 15x15 cm sheets of standard weight mesh (Bard), one for each side.  I cut a single sigmoid-shaped slit ~6cm from a corner of each mesh.  I placed the meshes into the preperitoneal space & laid them as overlapping diamonds such that at the inferior points, a 6x6 cm corner flap rested in the true anterolateral pelvis, covering the obturator & femoral foramina.   I allowed the bladder to return to the pubis, this helping tuck the corners of the mesh in the anteriolateral pelvis.  The medial corners overlapped each other across midline cephalad to the pubic rim.   Given the numerous hernias of moderate size, I placed a third 15x15cm mesh in the center as a vertical diamond.  The lateral wings of the mesh overlap across the direct spaces and internal rings where the dominant hernias were.  This provided good coverage and reinforcement of the hernia repairs.  Because of the central mesh placement with good overlap, I did not place any tacks.   I held the hernia sacs cephalad & evacuated carbon dioxide.  I closed the fascia with absorbable suture.  I closed the skin using 4-0 monocryl stitch.  Sterile dressings were applied.   The patient was extubated & arrived in the PACU in stable condition..  I had  discussed postoperative care with the patient in the holding area.  Instructions are written in the chart.  I made an attempt to locate family to discuss patient's status and recommendations.  I tried to reach the patient's sister per his request.  There was no answer and no voicemail.  Recovery room staff will try again later.   [name], M.D., F.A.C.S. Gastrointestinal and Minimally Invasive Surgery [facility] [address] [place], [place] [address] [phone] Main / Paging  05/02/2019 3:03 PM

## 2019-05-02 NOTE — Discharge Instructions (Signed)
HERNIA REPAIR: POST OP INSTRUCTIONS ° °###################################################################### ° °EAT °Gradually transition to a high fiber diet with a fiber supplement over the next few weeks after discharge.  Start with a pureed / full liquid diet (see below) ° °WALK °Walk an hour a day.  Control your pain to do that.   ° °CONTROL PAIN °Control pain so that you can walk, sleep, tolerate sneezing/coughing, and go up/down stairs. ° °HAVE A BOWEL MOVEMENT DAILY °Keep your bowels regular to avoid problems.  OK to try a laxative to override constipation.  OK to use an antidairrheal to slow down diarrhea.  Call if not better after 2 tries ° °CALL IF YOU HAVE PROBLEMS/CONCERNS °Call if you are still struggling despite following these instructions. °Call if you have concerns not answered by these instructions ° °###################################################################### ° ° ° °1. DIET: Follow a light bland diet & liquids the first 24 hours after arrival home, such as soup, liquids, starches, etc.  Be sure to drink plenty of fluids.  Quickly advance to a usual solid diet within a few days.  Avoid fast food or heavy meals as your are more likely to get nauseated or have irregular bowels.  A low-fat, high-fiber diet for the rest of your life is ideal.  ° °2. Take your usually prescribed home medications unless otherwise directed. ° °3. PAIN CONTROL: °a. Pain is best controlled by a usual combination of three different methods TOGETHER: °i. Ice/Heat °ii. Over the counter pain medication °iii. Prescription pain medication °b. Most patients will experience some swelling and bruising around the hernia(s) such as the bellybutton, groins, or old incisions.  Ice packs or heating pads (30-60 minutes up to 6 times a day) will help. Use ice for the first few days to help decrease swelling and bruising, then switch to heat to help relax tight/sore spots and speed recovery.  Some people prefer to use ice  alone, heat alone, alternating between ice & heat.  Experiment to what works for you.  Swelling and bruising can take several weeks to resolve.   °c. It is helpful to take an over-the-counter pain medication regularly for the first few weeks.  Choose one of the following that works best for you: °i. Naproxen (Aleve, etc)  Two 220mg tabs twice a day °ii. Ibuprofen (Advil, etc) Three 200mg tabs four times a day (every meal & bedtime) °iii. Acetaminophen (Tylenol, etc) 325-650mg four times a day (every meal & bedtime) °d. A  prescription for pain medication should be given to you upon discharge.  Take your pain medication as prescribed.  °i. If you are having problems/concerns with the prescription medicine (does not control pain, nausea, vomiting, rash, itching, etc), please call us [phone] to see if we need to switch you to a different pain medicine that will work better for you and/or control your side effect better. °ii. If you need a refill on your pain medication, please contact your pharmacy.  They will contact our office to request authorization. Prescriptions will not be filled after 5 pm or on week-ends. ° °4. Avoid getting constipated.  Between the surgery and the pain medications, it is common to experience some constipation.  Increasing fluid intake and taking a fiber supplement (such as Metamucil, Citrucel, FiberCon, MiraLax, etc) 1-2 times a day regularly will usually help prevent this problem from occurring.  A mild laxative (prune juice, Milk of Magnesia, MiraLax, etc) should be taken according to package directions if there are no bowel movements after 48   hours.    5. Wash / shower every day.  You may shower over the dressings as they are waterproof.    6. Remove your waterproof bandages, skin tapes, and other bandages 5 days after surgery. You may replace a dressing/Band-Aid to cover the incision for comfort if you wish. You may leave the incisions open to air.  You may replace a  dressing/Band-Aid to cover an incision for comfort if you wish.  Continue to shower over incision(s) after the dressing is off.  7. ACTIVITIES as tolerated:   a. You may resume regular (light) daily activities beginning the next day--such as daily self-care, walking, climbing stairs--gradually increasing activities as tolerated.  Control your pain so that you can walk an hour a day.  If you can walk 30 minutes without difficulty, it is safe to try more intense activity such as jogging, treadmill, bicycling, low-impact aerobics, swimming, etc. b. Save the most intensive and strenuous activity for last such as sit-ups, heavy lifting, contact sports, etc  Refrain from any heavy lifting or straining until you are off narcotics for pain control.   c. DO NOT PUSH THROUGH PAIN.  Let pain be your guide: If it hurts to do something, don't do it.  Pain is your body warning you to avoid that activity for another week until the pain goes down. d. You may drive when you are no longer taking prescription pain medication, you can comfortably wear a seatbelt, and you can safely maneuver your car and apply brakes. e. Dennis Bast may have sexual intercourse when it is comfortable.   8. FOLLOW UP in our office a. Please call [facility] at [phone] to set up an appointment to see your surgeon in the office for a follow-up appointment approximately 2-3 weeks after your surgery. b. Make sure that you call for this appointment the day you arrive home to insure a convenient appointment time.  9.  If you have disability of FMLA / Family leave forms, please bring the forms to the office for processing.  (do not give to your surgeon).  WHEN TO CALL us [phone]: 1. Poor pain control 2. Reactions / problems with new medications (rash/itching, nausea, etc)  3. Fever over 101.5 F (38.5 C) 4. Inability to urinate 5. Nausea and/or vomiting 6. Worsening swelling or bruising 7. Continued bleeding from incision. 8. Increased pain,  redness, or drainage from the incision   The clinic staff is available to answer your questions during regular business hours (8:30am-5pm).  Please dont hesitate to call and ask to speak to one of our nurses for clinical concerns.   If you have a medical emergency, go to the nearest emergency room or call 911.  A surgeon from [facility] is always on call at the hospitals in [facility], [place], [place], [place], [place]  [address] ?  [address] [address], [place], [place]   [address] MAIN: [phone] ? TOLL FREE: [phone] ? FAX: [phone] [email or web address]   Inguinal Hernia, Adult An inguinal hernia develops when fat or the intestines push through a weak spot in a muscle where your leg meets your lower abdomen (groin). This creates a bulge. This kind of hernia could also be:  In your scrotum, if you are male.  In folds of skin around your vagina, if you are male. There are three types of inguinal hernias:  Hernias that can be pushed back into the abdomen (are reducible). This type rarely  causes pain.  Hernias that are not reducible (are incarcerated).  Hernias that are not reducible and lose their blood supply (are strangulated). This type of hernia requires emergency surgery. What are the causes? This condition is caused by having a weak spot in the muscles or tissues in the groin. This weak spot develops over time. The hernia may poke through the weak spot when you suddenly strain your lower abdominal muscles, such as when you:  Lift a heavy object.  Strain to have a bowel movement. Constipation can lead to straining.  Cough. What increases the risk? This condition is more likely to develop in:  Men.  Pregnant women.  People who: ? Are overweight. ? Work in jobs that require long periods of standing or heavy lifting. ? Have had an inguinal hernia before. ? Smoke or have lung disease. These  factors can lead to long-lasting (chronic) coughing. What are the signs or symptoms? Symptoms may depend on the size of the hernia. Often, a small inguinal hernia has no symptoms. Symptoms of a larger hernia may include:  A lump in the groin area. This is easier to see when standing. It might not be visible when lying down.  Pain or burning in the groin. This may get worse when lifting, straining, or coughing.  A dull ache or a feeling of pressure in the groin.  In men, an unusual lump in the scrotum. Symptoms of a strangulated inguinal hernia may include:  A bulge in your groin that is very painful and tender to the touch.  A bulge that turns red or purple.  Fever, nausea, and vomiting.  Inability to have a bowel movement or to pass gas. How is this diagnosed? This condition is diagnosed based on your symptoms, your medical history, and a physical exam. Your health care provider may feel your groin area and ask you to cough. How is this treated? Treatment depends on the size of your hernia and whether you have symptoms. If you do not have symptoms, your health care provider may have you watch your hernia carefully and have you come in for follow-up visits. If your hernia is large or if you have symptoms, you may need surgery to repair the hernia. Follow these instructions at home: Lifestyle  Avoid lifting heavy objects.  Avoid standing for long periods of time.  Do not use any products that contain nicotine or tobacco, such as cigarettes and e-cigarettes. If you need help quitting, ask your health care provider.  Maintain a healthy weight. Preventing constipation  Take actions to prevent constipation. Constipation leads to straining with bowel movements, which can make a hernia worse or cause a hernia repair to break down. Your health care provider may recommend that you: ? Drink enough fluid to keep your urine pale yellow. ? Eat foods that are high in fiber, such as fresh  fruits and vegetables, whole grains, and beans. ? Limit foods that are high in fat and processed sugars, such as fried or sweet foods. ? Take an over-the-counter or prescription medicine for constipation. General instructions  You may try to push the hernia back in place by very gently pressing on it while lying down. Do not try to force the bulge back in if it will not push in easily.  Watch your hernia for any changes in shape, size, or color. Get help right away if you notice any changes.  Take over-the-counter and prescription medicines only as told by your health care provider.  Keep  all follow-up visits as told by your health care provider. This is important. Contact a health care provider if:  You have a fever.  You develop new symptoms.  Your symptoms get worse. Get help right away if:  You have pain in your groin that suddenly gets worse.  You have a bulge in your groin that: ? Suddenly gets bigger and does not get smaller. ? Becomes red or purple or painful to the touch.  You are a man and you have a sudden pain in your scrotum, or the size of your scrotum suddenly changes.  You cannot push the hernia back in place by very gently pressing on it when you are lying down. Do not try to force the bulge back in if it will not push in easily.  You have nausea or vomiting that does not go away.  You have a fast heartbeat.  You cannot have a bowel movement or pass gas. These symptoms may represent a serious problem that is an emergency. Do not wait to see if the symptoms will go away. Get medical help right away. Call your local emergency services (911 in the [place] Summary  An inguinal hernia develops when fat or the intestines push through a weak spot in a muscle where your leg meets your lower abdomen (groin).  This condition is caused by having a weak spot in muscles or tissue in your groin.  Symptoms may depend on the size of the hernia, and they may include pain or  swelling in your groin. A small inguinal hernia often has no symptoms.  Treatment may not be needed if you do not have symptoms. If you have symptoms or a large hernia, you may need surgery to repair the hernia.  Avoid lifting heavy objects. Also avoid standing for long amounts of time. This information is not intended to replace advice given to you by your health care provider. Make sure you discuss any questions you have with your health care provider. Document Released: 09/27/2008 Document Revised: 06/12/2017 Document Reviewed: 02/10/2017 Elsevier Patient Education  [place] Instructions  Activity: Get plenty of rest for the remainder of the day. A responsible individual must stay with you for 24 hours following the procedure.  For the next 24 hours, DO NOT: -Drive a car -Paediatric nurse -Drink alcoholic beverages -Take any medication unless instructed by your physician -Make any legal decisions or sign important papers.  Meals: Start with liquid foods such as gelatin or soup. Progress to regular foods as tolerated. Avoid greasy, spicy, heavy foods. If nausea and/or vomiting occur, drink only clear liquids until the nausea and/or vomiting subsides. Call your physician if vomiting continues.  Special Instructions/Symptoms: Your throat may feel dry or sore from the anesthesia or the breathing tube placed in your throat during surgery. If this causes discomfort, gargle with warm salt water. The discomfort should disappear within 24 hours.  Information for Discharge Teaching: EXPAREL (bupivacaine liposome injectable suspension)   Your surgeon or anesthesiologist gave you EXPAREL(bupivacaine) to help control your pain after surgery.   EXPAREL is a local anesthetic that provides pain relief by numbing the tissue around the surgical site.  EXPAREL is designed to release pain medication over time and can control pain for up to 72  hours.  Depending on how you respond to EXPAREL, you may require less pain medication during your recovery.  Possible side effects:  Temporary loss of sensation or ability to move in  the area where bupivacaine was injected.  Nausea, vomiting, constipation  Rarely, numbness and tingling in your mouth or lips, lightheadedness, or anxiety may occur.  Call your doctor right away if you think you may be experiencing any of these sensations, or if you have other questions regarding possible side effects.  Follow all other discharge instructions given to you by your surgeon or nurse. Eat a healthy diet and drink plenty of water or other fluids.  If you return to the hospital for any reason within 96 hours following the administration of EXPAREL, it is important for health care providers to know that you have received this anesthetic. A teal colored band has been placed on your arm with the date, time and amount of EXPAREL you have received in order to alert and inform your health care providers. Please leave this armband in place for the full 96 hours following administration, and then you may remove the band.

## 2019-05-02 NOTE — Progress Notes (Signed)
Dr. Lanetta Inch aware of BP right arm 193/116 and left arm 205/111 both in sitting position. New orders given.

## 2019-05-03 ENCOUNTER — Encounter (HOSPITAL_BASED_OUTPATIENT_CLINIC_OR_DEPARTMENT_OTHER): Payer: Self-pay | Admitting: Surgery

## 2019-05-03 NOTE — Progress Notes (Signed)
I discussed operative findings, updated the patient's status, discussed probable steps to recovery, and gave postoperative recommendations to the patient's sister.  Recommendations were made.  Questions were answered.  She expressed understanding & appreciation.

## 2019-05-03 NOTE — Anesthesia Postprocedure Evaluation (Signed)
Anesthesia Post Note  Patient: Orlene Plum Weitzman  Procedure(s) Performed: LAPAROSCOPIC BILATERAL INGUINAL HERNIA REPAIR WITH MESH, PRIMARY UMBILICAL HERNIA REPAIR (N/A Abdomen)     Patient location during evaluation: PACU Anesthesia Type: General Level of consciousness: awake and alert Pain management: pain level controlled Vital Signs Assessment: post-procedure vital signs reviewed and stable Respiratory status: spontaneous breathing, nonlabored ventilation, respiratory function stable and patient connected to nasal cannula oxygen Cardiovascular status: blood pressure returned to baseline and stable Postop Assessment: no apparent nausea or vomiting Anesthetic complications: no    Last Vitals:  Vitals:   05/02/19 1600 05/02/19 1647  BP: (!) 151/97 (!) 160/90  Pulse: 66 80  Resp: 13 20  Temp:    SpO2: 100% 99%    Last Pain:  Vitals:   05/03/19 1015  PainSc: 8                  Rollin Kotowski L Aiyannah Fayad

## 2019-05-08 ENCOUNTER — Other Ambulatory Visit: Payer: Self-pay

## 2019-05-08 ENCOUNTER — Other Ambulatory Visit (INDEPENDENT_AMBULATORY_CARE_PROVIDER_SITE_OTHER): Payer: 59

## 2019-05-08 ENCOUNTER — Ambulatory Visit (INDEPENDENT_AMBULATORY_CARE_PROVIDER_SITE_OTHER): Payer: 59 | Admitting: Internal Medicine

## 2019-05-08 ENCOUNTER — Encounter: Payer: Self-pay | Admitting: Internal Medicine

## 2019-05-08 ENCOUNTER — Other Ambulatory Visit: Payer: Self-pay | Admitting: Internal Medicine

## 2019-05-08 VITALS — BP 130/84 | HR 93 | Temp 98.4°F | Ht 74.0 in | Wt 237.0 lb

## 2019-05-08 DIAGNOSIS — E538 Deficiency of other specified B group vitamins: Secondary | ICD-10-CM

## 2019-05-08 DIAGNOSIS — E559 Vitamin D deficiency, unspecified: Secondary | ICD-10-CM | POA: Diagnosis not present

## 2019-05-08 DIAGNOSIS — I10 Essential (primary) hypertension: Secondary | ICD-10-CM | POA: Diagnosis not present

## 2019-05-08 DIAGNOSIS — R739 Hyperglycemia, unspecified: Secondary | ICD-10-CM | POA: Diagnosis not present

## 2019-05-08 DIAGNOSIS — Z114 Encounter for screening for human immunodeficiency virus [HIV]: Secondary | ICD-10-CM | POA: Diagnosis not present

## 2019-05-08 DIAGNOSIS — E785 Hyperlipidemia, unspecified: Secondary | ICD-10-CM | POA: Diagnosis not present

## 2019-05-08 DIAGNOSIS — Z0000 Encounter for general adult medical examination without abnormal findings: Secondary | ICD-10-CM | POA: Insufficient documentation

## 2019-05-08 DIAGNOSIS — E611 Iron deficiency: Secondary | ICD-10-CM

## 2019-05-08 DIAGNOSIS — Z0001 Encounter for general adult medical examination with abnormal findings: Secondary | ICD-10-CM | POA: Insufficient documentation

## 2019-05-08 LAB — URINALYSIS, ROUTINE W REFLEX MICROSCOPIC
Bilirubin Urine: NEGATIVE
Hgb urine dipstick: NEGATIVE
Ketones, ur: NEGATIVE
Leukocytes,Ua: NEGATIVE
Nitrite: NEGATIVE
RBC / HPF: NONE SEEN (ref 0–?)
Specific Gravity, Urine: 1.01 (ref 1.000–1.030)
Total Protein, Urine: NEGATIVE
Urine Glucose: NEGATIVE
Urobilinogen, UA: 0.2 (ref 0.0–1.0)
pH: 7.5 (ref 5.0–8.0)

## 2019-05-08 LAB — BASIC METABOLIC PANEL
BUN: 11 mg/dL (ref 6–23)
CO2: 28 mEq/L (ref 19–32)
Calcium: 9.8 mg/dL (ref 8.4–10.5)
Chloride: 98 mEq/L (ref 96–112)
Creatinine, Ser: 1.27 mg/dL (ref 0.40–1.50)
GFR: 69.92 mL/min (ref >60.00)
Glucose, Bld: 106 mg/dL — ABNORMAL HIGH (ref 70–99)
Potassium: 4 mEq/L (ref 3.5–5.1)
Sodium: 137 mEq/L (ref 135–145)

## 2019-05-08 LAB — CBC WITH DIFFERENTIAL/PLATELET
Basophils Absolute: 0.1 10*3/uL (ref 0.0–0.1)
Basophils Relative: 0.8 % (ref 0.0–3.0)
Eosinophils Absolute: 0.1 10*3/uL (ref 0.0–0.7)
Eosinophils Relative: 1.2 % (ref 0.0–5.0)
HCT: 45.1 % (ref 39.0–52.0)
Hemoglobin: 14.8 g/dL (ref 13.0–17.0)
Lymphocytes Relative: 17 % (ref 12.0–46.0)
Lymphs Abs: 2 10*3/uL (ref 0.7–4.0)
MCHC: 32.7 g/dL (ref 30.0–36.0)
MCV: 79.6 fl (ref 78.0–100.0)
Monocytes Absolute: 0.8 10*3/uL (ref 0.1–1.0)
Monocytes Relative: 7.1 % (ref 3.0–12.0)
Neutro Abs: 8.9 10*3/uL — ABNORMAL HIGH (ref 1.4–7.7)
Neutrophils Relative %: 73.9 % (ref 43.0–77.0)
Platelets: 320 10*3/uL (ref 150.0–400.0)
RBC: 5.67 Mil/uL (ref 4.22–5.81)
RDW: 15.1 % (ref 11.5–15.5)
WBC: 12 10*3/uL — ABNORMAL HIGH (ref 4.0–10.5)

## 2019-05-08 LAB — HEPATIC FUNCTION PANEL
ALT: 12 U/L (ref 0–53)
AST: 12 U/L (ref 0–37)
Albumin: 4.4 g/dL (ref 3.5–5.2)
Alkaline Phosphatase: 96 U/L (ref 39–117)
Bilirubin, Direct: 0.2 mg/dL (ref 0.0–0.3)
Total Bilirubin: 1.6 mg/dL — ABNORMAL HIGH (ref 0.2–1.2)
Total Protein: 7.1 g/dL (ref 6.0–8.3)

## 2019-05-08 LAB — IBC PANEL
Iron: 56 ug/dL (ref 42–165)
Saturation Ratios: 21.1 % (ref 20.0–50.0)
Transferrin: 190 mg/dL — ABNORMAL LOW (ref 212.0–360.0)

## 2019-05-08 LAB — LIPID PANEL
Cholesterol: 301 mg/dL — ABNORMAL HIGH (ref 0–200)
HDL: 37.7 mg/dL — ABNORMAL LOW (ref >39.00)
LDL Cholesterol: 239 mg/dL — ABNORMAL HIGH (ref 0–99)
NonHDL: 262.98
Total CHOL/HDL Ratio: 8
Triglycerides: 120 mg/dL (ref 0.0–149.0)
VLDL: 24 mg/dL (ref 0.0–40.0)

## 2019-05-08 LAB — HEMOGLOBIN A1C: Hgb A1c MFr Bld: 5.9 % (ref 4.6–6.5)

## 2019-05-08 LAB — VITAMIN D 25 HYDROXY (VIT D DEFICIENCY, FRACTURES): VITD: 15.08 ng/mL — ABNORMAL LOW (ref 30.00–100.00)

## 2019-05-08 LAB — PSA: PSA: 1.81 ng/mL (ref 0.10–4.00)

## 2019-05-08 LAB — TSH: TSH: 0.99 u[IU]/mL (ref 0.35–4.50)

## 2019-05-08 LAB — VITAMIN B12: Vitamin B-12: 353 pg/mL (ref 211–911)

## 2019-05-08 MED ORDER — ROSUVASTATIN CALCIUM 40 MG PO TABS: 40.0000 mg | ORAL_TABLET | Freq: Every day | ORAL | 3 refills | Status: DC

## 2019-05-08 MED ORDER — VITAMIN D (ERGOCALCIFEROL) 1.25 MG (50000 UNIT) PO CAPS: 50000.0000 [IU] | ORAL_CAPSULE | ORAL | 0 refills | Status: DC

## 2019-05-08 MED ORDER — LOSARTAN POTASSIUM-HCTZ 100-12.5 MG PO TABS: 1.0000 | ORAL_TABLET | Freq: Every day | ORAL | 3 refills | Status: DC

## 2019-05-08 MED ORDER — AMLODIPINE BESYLATE 5 MG PO TABS: ORAL_TABLET | ORAL | 1 refills | Status: DC

## 2019-05-08 MED ORDER — AMLODIPINE BESYLATE 5 MG PO TABS: ORAL_TABLET | ORAL | 3 refills | Status: DC

## 2019-05-08 NOTE — Patient Instructions (Signed)

## 2019-05-08 NOTE — Assessment & Plan Note (Signed)
stable overall by history and exam, recent data reviewed with pt, and pt to continue medical treatment as before,  to f/u any worsening symptoms or concerns  

## 2019-05-08 NOTE — Progress Notes (Signed)
Subjective:    Patient ID: Alejandro Middleton, male    DOB: [birth year], 58 y.o.   MRN: [id number]  HPI  Here for wellness and f/u;  Overall doing ok;  Pt denies Chest pain, worsening SOB, DOE, wheezing, orthopnea, PND, worsening LE edema, palpitations, dizziness or syncope.  Pt denies neurological change such as new headache, facial or extremity weakness.  Pt denies polydipsia, polyuria, or low sugar symptoms. Pt states overall good compliance with treatment and medications, good tolerability, and has been trying to follow appropriate diet.  Pt denies worsening depressive symptoms, suicidal ideation or panic. No fever, night sweats, wt loss, loss of appetite, or other constitutional symptoms.  Pt states good ability with ADL's, has low fall risk, home safety reviewed and adequate, no other significant changes in hearing or vision, and only occasionally active with exercise.  Had to quit parttime moving furniture job. No new complaints Past Medical History:  Diagnosis Date  . Erectile dysfunction   . History of renal stone 01/14/2012  . Hyperlipidemia   . Hypertension   . Inguinal hernia    bilateral  . OSA (obstructive sleep apnea)    s/p surgery   Past Surgical History:  Procedure Laterality Date  . INGUINAL HERNIA REPAIR N/A 05/02/2019   Procedure: LAPAROSCOPIC BILATERAL INGUINAL HERNIA REPAIR WITH MESH, PRIMARY UMBILICAL HERNIA REPAIR;  Surgeon: Michael Boston, MD;  Location: [facility];  Service: General;  Laterality: N/A;  . s/p deviated nasal septum  2001  . s/p uvulopaltoplasty  2001  . VASECTOMY     Dr. Janice Norrie    reports that he has never smoked. He has never used smokeless tobacco. He reports previous alcohol use. He reports that he does not use drugs. family history includes Cancer in his sister; Glaucoma in his mother; Heart attack (age of onset: 3) in his father; Hypertension in his mother; Stroke (age of onset: 48) in his mother. No Known Allergies Current  Outpatient Medications on File Prior to Visit  Medication Sig Dispense Refill  . acetaminophen (TYLENOL) 500 MG tablet Take 1,000 mg by mouth every 6 (six) hours as needed.    Marland Kitchen aspirin EC 81 MG tablet Take 1 tablet (81 mg total) by mouth daily. 90 tablet 3   No current facility-administered medications on file prior to visit.   Review of Systems Constitutional: Negative for other unusual diaphoresis, sweats, appetite or weight changes HENT: Negative for other worsening hearing loss, ear pain, facial swelling, mouth sores or neck stiffness.   Eyes: Negative for other worsening pain, redness or other visual disturbance.  Respiratory: Negative for other stridor or swelling Cardiovascular: Negative for other palpitations or other chest pain  Gastrointestinal: Negative for worsening diarrhea or loose stools, blood in stool, distention or other pain Genitourinary: Negative for hematuria, flank pain or other change in urine volume.  Musculoskeletal: Negative for myalgias or other joint swelling.  Skin: Negative for other color change, or other wound or worsening drainage.  Neurological: Negative for other syncope or numbness. Hematological: Negative for other adenopathy or swelling Psychiatric/Behavioral: Negative for hallucinations, other worsening agitation, SI, self-injury, or new decreased concentration All otherwise neg per pt     Objective:   Physical Exam BP 130/84   Pulse 93   Temp 98.4 F (36.9 C) (Oral)   Ht 6\' 2"  (1.88 m)   Wt 237 lb (107.5 kg)   SpO2 99%   BMI 30.43 kg/m  VS noted,  Constitutional: Pt is oriented to person,  place, and time. Appears well-developed and well-nourished, in no significant distress and comfortable Head: Normocephalic and atraumatic  Eyes: Conjunctivae and EOM are normal. Pupils are equal, round, and reactive to light Right Ear: External ear normal without discharge Left Ear: External ear normal without discharge Nose: Nose without discharge or  deformity Mouth/Throat: Oropharynx is without other ulcerations and moist  Neck: Normal range of motion. Neck supple. No JVD present. No tracheal deviation present or significant neck LA or mass Cardiovascular: Normal rate, regular rhythm, normal heart sounds and intact distal pulses.   Pulmonary/Chest: WOB normal and breath sounds without rales or wheezing  Abdominal: Soft. Bowel sounds are normal. NT. No HSM  Musculoskeletal: Normal range of motion. Exhibits no edema Lymphadenopathy: Has no other cervical adenopathy.  Neurological: Pt is alert and oriented to person, place, and time. Pt has normal reflexes. No cranial nerve deficit. Motor grossly intact, Gait intact Skin: Skin is warm and dry. No rash noted or new ulcerations Psychiatric:  Has normal mood and affect. Behavior is normal without agitation All otherwise neg per pt Lab Results  Component Value Date   WBC 12.0 (H) 05/08/2019   HGB 14.8 05/08/2019   HCT 45.1 05/08/2019   PLT 320.0 05/08/2019   GLUCOSE 106 (H) 05/08/2019   CHOL 301 (H) 05/08/2019   TRIG 120.0 05/08/2019   HDL 37.70 (L) 05/08/2019   LDLDIRECT 219.2 01/14/2012   LDLCALC 239 (H) 05/08/2019   ALT 12 05/08/2019   AST 12 05/08/2019   NA 137 05/08/2019   K 4.0 05/08/2019   CL 98 05/08/2019   CREATININE 1.27 05/08/2019   BUN 11 05/08/2019   CO2 28 05/08/2019   TSH 0.99 05/08/2019   PSA 1.81 05/08/2019   INR 1.0 01/21/2011   HGBA1C 5.9 05/08/2019        Assessment & Plan:

## 2019-05-08 NOTE — Assessment & Plan Note (Signed)

## 2019-05-08 NOTE — Assessment & Plan Note (Signed)
For statin restart,  to f/u any worsening symptoms or concerns

## 2019-05-09 LAB — HIV ANTIBODY (ROUTINE TESTING W REFLEX): HIV 1&2 Ab, 4th Generation: NONREACTIVE

## 2019-09-13 ENCOUNTER — Other Ambulatory Visit: Payer: Self-pay | Admitting: Internal Medicine

## 2019-09-13 NOTE — Telephone Encounter (Signed)
Please change to OTC Vitamin D3 at 2000 units per day, indefinitely.  

## 2020-05-09 ENCOUNTER — Other Ambulatory Visit: Payer: Self-pay | Admitting: Internal Medicine

## 2020-05-09 NOTE — Telephone Encounter (Signed)
Please refill as per office routine med refill policy (all routine meds refilled for 3 mo or monthly per pt preference up to one year from last visit, then month to month grace period for 3 mo, then further med refills will have to be denied)  

## 2021-03-03 ENCOUNTER — Ambulatory Visit (INDEPENDENT_AMBULATORY_CARE_PROVIDER_SITE_OTHER): Payer: 59

## 2021-03-03 ENCOUNTER — Ambulatory Visit: Payer: 59 | Admitting: Podiatry

## 2021-03-03 ENCOUNTER — Other Ambulatory Visit: Payer: Self-pay

## 2021-03-03 ENCOUNTER — Ambulatory Visit: Payer: 59

## 2021-03-03 DIAGNOSIS — M722 Plantar fascial fibromatosis: Secondary | ICD-10-CM

## 2021-03-03 DIAGNOSIS — M7662 Achilles tendinitis, left leg: Secondary | ICD-10-CM | POA: Diagnosis not present

## 2021-03-03 MED ORDER — TRIAMCINOLONE ACETONIDE 10 MG/ML IJ SUSP
10.0000 mg | Freq: Once | INTRAMUSCULAR | Status: AC
  Administered 2021-03-03: 10 mg

## 2021-03-03 MED ORDER — MELOXICAM 15 MG PO TABS: 15.0000 mg | ORAL_TABLET | Freq: Every day | ORAL | 0 refills | Status: DC | PRN

## 2021-03-03 NOTE — Patient Instructions (Addendum)
For instructions on how to put on your Plantar Fascial Brace, please visit [email or web address]   Plantar Fasciitis (Heel Spur Syndrome) with Rehab The plantar fascia is a fibrous, ligament-like, soft-tissue structure that spans the bottom of the foot. Plantar fasciitis is a condition that causes pain in the foot due to inflammation of the tissue. SYMPTOMS   Pain and tenderness on the underneath side of the foot.  Pain that worsens with standing or walking. CAUSES  Plantar fasciitis is caused by irritation and injury to the plantar fascia on the underneath side of the foot. Common mechanisms of injury include:  Direct trauma to bottom of the foot.  Damage to a small nerve that runs under the foot where the main fascia attaches to the heel bone.  Stress placed on the plantar fascia due to bone spurs. RISK INCREASES WITH:   Activities that place stress on the plantar fascia (running, jumping, pivoting, or cutting).  Poor strength and flexibility.  Improperly fitted shoes.  Tight calf muscles.  Flat feet.  Failure to warm-up properly before activity.  Obesity. PREVENTION  Warm up and stretch properly before activity.  Allow for adequate recovery between workouts.  Maintain physical fitness:  Strength, flexibility, and endurance.  Cardiovascular fitness.  Maintain a health body weight.  Avoid stress on the plantar fascia.  Wear properly fitted shoes, including arch supports for individuals who have flat feet.  PROGNOSIS  If treated properly, then the symptoms of plantar fasciitis usually resolve without surgery. However, occasionally surgery is necessary.  RELATED COMPLICATIONS   Recurrent symptoms that may result in a chronic condition.  Problems of the lower back that are caused by compensating for the injury, such as limping.  Pain or weakness of the foot during push-off following surgery.  Chronic inflammation, scarring, and partial or complete  fascia tear, occurring more often from repeated injections.  TREATMENT  Treatment initially involves the use of ice and medication to help reduce pain and inflammation. The use of strengthening and stretching exercises may help reduce pain with activity, especially stretches of the Achilles tendon. These exercises may be performed at home or with a therapist. Your caregiver may recommend that you use heel cups of arch supports to help reduce stress on the plantar fascia. Occasionally, corticosteroid injections are given to reduce inflammation. If symptoms persist for greater than 6 months despite non-surgical (conservative), then surgery may be recommended.   MEDICATION   If pain medication is necessary, then nonsteroidal anti-inflammatory medications, such as aspirin and ibuprofen, or other minor pain relievers, such as acetaminophen, are often recommended.  Do not take pain medication within 7 days before surgery.  Prescription pain relievers may be given if deemed necessary by your caregiver. Use only as directed and only as much as you need.  Corticosteroid injections may be given by your caregiver. These injections should be reserved for the most serious cases, because they may only be given a certain number of times.  HEAT AND COLD  Cold treatment (icing) relieves pain and reduces inflammation. Cold treatment should be applied for 10 to 15 minutes every 2 to 3 hours for inflammation and pain and immediately after any activity that aggravates your symptoms. Use ice packs or massage the area with a piece of ice (ice massage).  Heat treatment may be used prior to performing the stretching and strengthening activities prescribed by your caregiver, physical therapist, or athletic trainer. Use a heat pack or soak the injury in warm water.  SEEK IMMEDIATE MEDICAL   CARE IF:  Treatment seems to offer no benefit, or the condition worsens.  Any medications produce adverse side effects.   EXERCISES- RANGE OF MOTION (ROM) AND STRETCHING EXERCISES - Plantar Fasciitis (Heel Spur Syndrome) These exercises may help you when beginning to rehabilitate your injury. Your symptoms may resolve with or without further involvement from your physician, physical therapist or athletic trainer. While completing these exercises, remember:   Restoring tissue flexibility helps normal motion to return to the joints. This allows healthier, less painful movement and activity.  An effective stretch should be held for at least 30 seconds.  A stretch should never be painful. You should only feel a gentle lengthening or release in the stretched tissue.  RANGE OF MOTION - Toe Extension, Flexion  Sit with your right / left leg crossed over your opposite knee.  Grasp your toes and gently pull them back toward the top of your foot. You should feel a stretch on the bottom of your toes and/or foot.  Hold this stretch for 10 seconds.  Now, gently pull your toes toward the bottom of your foot. You should feel a stretch on the top of your toes and or foot.  Hold this stretch for 10 seconds. Repeat  times. Complete this stretch 3 times per day.   RANGE OF MOTION - Ankle Dorsiflexion, Active Assisted  Remove shoes and sit on a chair that is preferably not on a carpeted surface.  Place right / left foot under knee. Extend your opposite leg for support.  Keeping your heel down, slide your right / left foot back toward the chair until you feel a stretch at your ankle or calf. If you do not feel a stretch, slide your bottom forward to the edge of the chair, while still keeping your heel down.  Hold this stretch for 10 seconds. Repeat 3 times. Complete this stretch 2 times per day.   STRETCH  Gastroc, Standing  Place hands on wall.  Extend right / left leg, keeping the front knee somewhat bent.  Slightly point your toes inward on your back foot.  Keeping your right / left heel on the floor and your  knee straight, shift your weight toward the wall, not allowing your back to arch.  You should feel a gentle stretch in the right / left calf. Hold this position for 10 seconds. Repeat 3 times. Complete this stretch 2 times per day.  STRETCH  Soleus, Standing  Place hands on wall.  Extend right / left leg, keeping the other knee somewhat bent.  Slightly point your toes inward on your back foot.  Keep your right / left heel on the floor, bend your back knee, and slightly shift your weight over the back leg so that you feel a gentle stretch deep in your back calf.  Hold this position for 10 seconds. Repeat 3 times. Complete this stretch 2 times per day.  STRETCH  Gastrocsoleus, Standing  Note: This exercise can place a lot of stress on your foot and ankle. Please complete this exercise only if specifically instructed by your caregiver.   Place the ball of your right / left foot on a step, keeping your other foot firmly on the same step.  Hold on to the wall or a rail for balance.  Slowly lift your other foot, allowing your body weight to press your heel down over the edge of the step.  You should feel a stretch in your right / left calf.  Hold this   position for 10 seconds.  Repeat this exercise with a slight bend in your right / left knee. Repeat 3 times. Complete this stretch 2 times per day.   STRENGTHENING EXERCISES - Plantar Fasciitis (Heel Spur Syndrome)  These exercises may help you when beginning to rehabilitate your injury. They may resolve your symptoms with or without further involvement from your physician, physical therapist or athletic trainer. While completing these exercises, remember:   Muscles can gain both the endurance and the strength needed for everyday activities through controlled exercises.  Complete these exercises as instructed by your physician, physical therapist or athletic trainer. Progress the resistance and repetitions only as guided.  STRENGTH -  Towel Curls  Sit in a chair positioned on a non-carpeted surface.  Place your foot on a towel, keeping your heel on the floor.  Pull the towel toward your heel by only curling your toes. Keep your heel on the floor. Repeat 3 times. Complete this exercise 2 times per day.  STRENGTH - Ankle Inversion  Secure one end of a rubber exercise band/tubing to a fixed object (table, pole). Loop the other end around your foot just before your toes.  Place your fists between your knees. This will focus your strengthening at your ankle.  Slowly, pull your big toe up and in, making sure the band/tubing is positioned to resist the entire motion.  Hold this position for 10 seconds.  Have your muscles resist the band/tubing as it slowly pulls your foot back to the starting position. Repeat 3 times. Complete this exercises 2 times per day.  Document Released: 05/11/2005 Document Revised: 08/03/2011 Document Reviewed: 08/23/2008 Exit[employer] Patient Information 2014 [employer]. 

## 2021-03-06 NOTE — Progress Notes (Signed)
Subjective:   Patient ID: Alejandro Middleton, male   DOB: 60 y.o.   MRN: [id number]   HPI 60 year old male presents the office today for concerns of left heel pain on the bottom of his heel.  He states has been on about 2 weeks and he is not recalling any injury.  He states that he will get occasional burning sensation, the heel.  He states he does get some pain points on the Achilles tendon as well.  He states that is worse after being on his foot too much or in the morning when he first gets up and gets better with activity.  He has been icing.  He has no other concerns.   Review of Systems  All other systems reviewed and are negative.  Past Medical History:  Diagnosis Date   Erectile dysfunction    History of renal stone 01/14/2012   Hyperlipidemia    Hypertension    Inguinal hernia    bilateral   OSA (obstructive sleep apnea)    s/p surgery    Past Surgical History:  Procedure Laterality Date   INGUINAL HERNIA REPAIR N/A 05/02/2019   Procedure: LAPAROSCOPIC BILATERAL INGUINAL HERNIA REPAIR WITH MESH, PRIMARY UMBILICAL HERNIA REPAIR;  Surgeon: Michael Boston, MD;  Location: [facility];  Service: General;  Laterality: N/A;   s/p deviated nasal septum  2001   s/p uvulopaltoplasty  2001   VASECTOMY     Dr. Janice Norrie     Current Outpatient Medications:    meloxicam (MOBIC) 15 MG tablet, Take 1 tablet (15 mg total) by mouth daily as needed for pain., Disp: 14 tablet, Rfl: 0   amLODipine (NORVASC) 5 MG tablet, TAKE 1 TABLET(5 MG) BY MOUTH DAILY, Disp: 90 tablet, Rfl: 3   aspirin EC 81 MG tablet, Take 1 tablet (81 mg total) by mouth daily., Disp: 90 tablet, Rfl: 3   losartan-hydrochlorothiazide (HYZAAR) 100-12.5 MG tablet, TAKE 1 TABLET BY MOUTH DAILY, Disp: 90 tablet, Rfl: 3   rosuvastatin (CRESTOR) 40 MG tablet, TAKE 1 TABLET(40 MG) BY MOUTH DAILY, Disp: 90 tablet, Rfl: 3   Vitamin D, Ergocalciferol, (DRISDOL) 1.25 MG (50000 UT) CAPS capsule, Take 1 capsule (50,000 Units  total) by mouth every 7 (seven) days., Disp: 12 capsule, Rfl: 0  No Known Allergies        Objective:  Physical Exam  General: AAO x3, NAD  Dermatological: Skin is warm, dry and supple bilateral. There are no open sores, no preulcerative lesions, no rash or signs of infection present.  Vascular: Dorsalis Pedis artery and Posterior Tibial artery pedal pulses are 2/4 bilateral with immedate capillary fill time.  There is no pain with calf compression, swelling, warmth, erythema.   Neruologic: Grossly intact via light touch bilateral.  Negative Tinel sign.  Musculoskeletal: The majority tenderness is on the plantar aspect calcaneus along the insertion of the plantar fascia on the left side.  There is no pain with lateral compression of calcaneus.  Slight discomfort on the Achilles tendon but [place] Silos test is negative and Achilles tendon appears to be intact.   Muscular strength 5/5 in all groups tested bilateral.  Gait: Unassisted, Nonantalgic.       Assessment:   60 year old male with plantar fasciitis, Achilles tendinitis     Plan:  -Treatment options discussed including all alternatives, risks, and complications -Etiology of symptoms were discussed -X-rays were obtained and reviewed with the patient.  There is no evidence of acute fracture or stress fracture identified today. -Steroid  injection performed.  See procedure note below. -Prescribed mobic. Discussed side effects of the medication and directed to stop if any are to occur and call the office.  -Plantar fascial brace dispensed -Discussion modification, arch supports.  Discussed stretching, icing daily.  Procedure: Injection Tendon/Ligament Discussed alternatives, risks, complications and verbal consent was obtained.  Location: Left plantar fascia at the glabrous junction; medial approach. Skin Prep: Alcohol. Injectate: 0.5cc 0.5% marcaine plain, 0.5 cc 2% lidocaine plain and, 1 cc kenalog 10. Disposition: Patient  tolerated procedure well. Injection site dressed with a band-aid.  Post-injection care was discussed and return precautions discussed.   Return if symptoms worsen or fail to improve.  Trula Slade DPM

## 2021-03-20 ENCOUNTER — Other Ambulatory Visit: Payer: Self-pay | Admitting: Podiatry

## 2021-03-20 NOTE — Telephone Encounter (Signed)
Please advise 

## 2021-04-03 ENCOUNTER — Other Ambulatory Visit: Payer: Self-pay | Admitting: Podiatry

## 2021-04-03 NOTE — Telephone Encounter (Signed)
Please advise 

## 2021-04-08 NOTE — Telephone Encounter (Signed)
Medication has been received by pharmacy 04/07/21

## 2021-04-27 ENCOUNTER — Other Ambulatory Visit: Payer: Self-pay | Admitting: Podiatry

## 2021-06-16 ENCOUNTER — Other Ambulatory Visit: Payer: Self-pay | Admitting: Internal Medicine

## 2021-06-16 NOTE — Telephone Encounter (Signed)
Please refill as per office routine med refill policy (all routine meds to be refilled for 3 mo or monthly (per pt preference) up to one year from last visit, then month to month grace period for 3 mo, then further med refills will have to be denied) ? ?

## 2021-07-25 DIAGNOSIS — M79672 Pain in left foot: Secondary | ICD-10-CM | POA: Insufficient documentation

## 2021-09-23 ENCOUNTER — Ambulatory Visit (INDEPENDENT_AMBULATORY_CARE_PROVIDER_SITE_OTHER): Payer: 59 | Admitting: Internal Medicine

## 2021-09-23 ENCOUNTER — Encounter: Payer: Self-pay | Admitting: Internal Medicine

## 2021-09-23 VITALS — BP 146/80 | HR 67 | Ht 74.0 in | Wt 243.0 lb

## 2021-09-23 DIAGNOSIS — I10 Essential (primary) hypertension: Secondary | ICD-10-CM

## 2021-09-23 DIAGNOSIS — E559 Vitamin D deficiency, unspecified: Secondary | ICD-10-CM | POA: Diagnosis not present

## 2021-09-23 DIAGNOSIS — R739 Hyperglycemia, unspecified: Secondary | ICD-10-CM

## 2021-09-23 DIAGNOSIS — E538 Deficiency of other specified B group vitamins: Secondary | ICD-10-CM

## 2021-09-23 DIAGNOSIS — E7800 Pure hypercholesterolemia, unspecified: Secondary | ICD-10-CM | POA: Diagnosis not present

## 2021-09-23 DIAGNOSIS — Z0001 Encounter for general adult medical examination with abnormal findings: Secondary | ICD-10-CM

## 2021-09-23 LAB — URINALYSIS, ROUTINE W REFLEX MICROSCOPIC
Bilirubin Urine: NEGATIVE
Hgb urine dipstick: NEGATIVE
Ketones, ur: NEGATIVE
Leukocytes,Ua: NEGATIVE
Nitrite: NEGATIVE
RBC / HPF: NONE SEEN (ref 0–?)
Specific Gravity, Urine: 1.025 (ref 1.000–1.030)
Total Protein, Urine: NEGATIVE
Urine Glucose: NEGATIVE
Urobilinogen, UA: 0.2 (ref 0.0–1.0)
pH: 6 (ref 5.0–8.0)

## 2021-09-23 LAB — BASIC METABOLIC PANEL
BUN: 19 mg/dL (ref 6–23)
CO2: 28 mEq/L (ref 19–32)
Calcium: 9.9 mg/dL (ref 8.4–10.5)
Chloride: 102 mEq/L (ref 96–112)
Creatinine, Ser: 1.52 mg/dL — ABNORMAL HIGH (ref 0.40–1.50)
GFR: 48.74 mL/min — ABNORMAL LOW (ref >60.00)
Glucose, Bld: 95 mg/dL (ref 70–99)
Potassium: 4.1 mEq/L (ref 3.5–5.1)
Sodium: 139 mEq/L (ref 135–145)

## 2021-09-23 LAB — CBC WITH DIFFERENTIAL/PLATELET
Basophils Absolute: 0 10*3/uL (ref 0.0–0.1)
Basophils Relative: 0.4 % (ref 0.0–3.0)
Eosinophils Absolute: 0.1 10*3/uL (ref 0.0–0.7)
Eosinophils Relative: 0.7 % (ref 0.0–5.0)
HCT: 46.7 % (ref 39.0–52.0)
Hemoglobin: 15.1 g/dL (ref 13.0–17.0)
Lymphocytes Relative: 19.7 % (ref 12.0–46.0)
Lymphs Abs: 2 10*3/uL (ref 0.7–4.0)
MCHC: 32.3 g/dL (ref 30.0–36.0)
MCV: 80.7 fl (ref 78.0–100.0)
Monocytes Absolute: 0.7 10*3/uL (ref 0.1–1.0)
Monocytes Relative: 6.7 % (ref 3.0–12.0)
Neutro Abs: 7.3 10*3/uL (ref 1.4–7.7)
Neutrophils Relative %: 72.5 % (ref 43.0–77.0)
Platelets: 254 10*3/uL (ref 150.0–400.0)
RBC: 5.78 Mil/uL (ref 4.22–5.81)
RDW: 16.1 % — ABNORMAL HIGH (ref 11.5–15.5)
WBC: 10 10*3/uL (ref 4.0–10.5)

## 2021-09-23 LAB — HEPATIC FUNCTION PANEL
ALT: 13 U/L (ref 0–53)
AST: 15 U/L (ref 0–37)
Albumin: 4.9 g/dL (ref 3.5–5.2)
Alkaline Phosphatase: 93 U/L (ref 39–117)
Bilirubin, Direct: 0.2 mg/dL (ref 0.0–0.3)
Total Bilirubin: 1.6 mg/dL — ABNORMAL HIGH (ref 0.2–1.2)
Total Protein: 7.6 g/dL (ref 6.0–8.3)

## 2021-09-23 LAB — LIPID PANEL
Cholesterol: 213 mg/dL — ABNORMAL HIGH (ref 0–200)
HDL: 51.7 mg/dL (ref >39.00)
LDL Cholesterol: 141 mg/dL — ABNORMAL HIGH (ref 0–99)
NonHDL: 161.75
Total CHOL/HDL Ratio: 4
Triglycerides: 105 mg/dL (ref 0.0–149.0)
VLDL: 21 mg/dL (ref 0.0–40.0)

## 2021-09-23 LAB — VITAMIN D 25 HYDROXY (VIT D DEFICIENCY, FRACTURES): VITD: 20.28 ng/mL — ABNORMAL LOW (ref 30.00–100.00)

## 2021-09-23 LAB — VITAMIN B12: Vitamin B-12: 245 pg/mL (ref 211–911)

## 2021-09-23 LAB — PSA: PSA: 1.37 ng/mL (ref 0.10–4.00)

## 2021-09-23 LAB — TSH: TSH: 1.2 u[IU]/mL (ref 0.35–5.50)

## 2021-09-23 LAB — HEMOGLOBIN A1C: Hgb A1c MFr Bld: 5.8 % (ref 4.6–6.5)

## 2021-09-23 MED ORDER — AMLODIPINE BESYLATE 10 MG PO TABS: 10.0000 mg | ORAL_TABLET | Freq: Every day | ORAL | 3 refills | Status: DC

## 2021-09-23 MED ORDER — LOSARTAN POTASSIUM-HCTZ 100-12.5 MG PO TABS: 1.0000 | ORAL_TABLET | Freq: Every day | ORAL | 3 refills | Status: DC

## 2021-09-23 MED ORDER — ROSUVASTATIN CALCIUM 40 MG PO TABS
ORAL_TABLET | ORAL | 3 refills | Status: DC
Start: 2021-09-23 — End: 2022-10-14

## 2021-09-23 NOTE — Patient Instructions (Addendum)
Please take OTC Vitamin D3 at 2000 units per day, indefinitely  ? ?We have discussed the Cardiac CT Score test to measure the calcification level (if any) in your heart arteries.  This test has been ordered in our [place], so please call [facility] directly, as they prefer this, at [phone] to be scheduled. ? ?Please continue all other medications as before, and refills have been done if requested - for BP and cholesterol ? ?Please have the pharmacy call with any other refills you may need. ? ?Please continue your efforts at being more active, low cholesterol diet, and weight control. ? ?You are otherwise up to date with prevention measures today. ? ?Please keep your appointments with your specialists as you may have planned ? ?Please go to the LAB at the blood drawing area for the tests to be done ? ?You will be contacted by phone if any changes need to be made immediately.  Otherwise, you will receive a letter about your results with an explanation, but please check with MyChart first. ? ?Please remember to sign up for MyChart if you have not done so, as this will be important to you in the future with finding out test results, communicating by private email, and scheduling acute appointments online when needed. ? ?Please make an Appointment to return in 6 months, or sooner if needed ? ? ? ? ?

## 2021-09-23 NOTE — Assessment & Plan Note (Signed)
Last vitamin D ?Lab Results  ?Component Value Date  ? VD25OH 15.08 (L) 05/08/2019  ? ?Low, to start oral replacement ? ?

## 2021-09-23 NOTE — Progress Notes (Signed)
Patient ID: Alejandro Middleton, male   DOB: [birth year], 61 y.o.   MRN: [id number] ? ? ? ?     Chief Complaint:: wellness exam and htn uncontrolled, obesity, low vit d, hld uncontrolled ? ?     HPI:  Alejandro Middleton is a 61 y.o. male here for wellness exam; declines colonoscopy, and shingrix, and o/w up to date ?         ?              Also out of med for BP and hld for 2 wks, not taking Vit D.  Walking 3 miles 2 days per wk but planning to increase to 4 miles at 4 days per wk.  Pt denies chest pain, increased sob or doe, wheezing, orthopnea, PND, increased LE swelling, palpitations, dizziness or syncope.   Pt denies polydipsia, polyuria, or new focal neuro s/s.    Pt denies fever, wt loss, night sweats, loss of appetite, or other constitutional symptoms   ?Wt Readings from Last 3 Encounters:  ?09/23/21 243 lb (110.2 kg)  ?05/08/19 237 lb (107.5 kg)  ?05/02/19 242 lb 2 oz (109.8 kg)  ? ?BP Readings from Last 3 Encounters:  ?09/23/21 (!) 146/80  ?05/08/19 130/84  ?05/02/19 (!) 160/90  ? ?Immunization History  ?Administered Date(s) Administered  ? PFIZER(Purple Top)SARS-COV-2 Vaccination 08/10/2019, 08/31/2019, 05/13/2020  ? Pneumococcal Polysaccharide-23 05/25/2001  ? Td 05/25/2001  ? Tdap 01/14/2012  ? ?There are no preventive care reminders to display for this patient. ? ?  ? ?Past Medical History:  ?Diagnosis Date  ? Erectile dysfunction   ? History of renal stone 01/14/2012  ? Hyperlipidemia   ? Hypertension   ? Inguinal hernia   ? bilateral  ? OSA (obstructive sleep apnea)   ? s/p surgery  ? ?Past Surgical History:  ?Procedure Laterality Date  ? INGUINAL HERNIA REPAIR N/A 05/02/2019  ? Procedure: LAPAROSCOPIC BILATERAL INGUINAL HERNIA REPAIR WITH MESH, PRIMARY UMBILICAL HERNIA REPAIR;  Surgeon: Michael Boston, MD;  Location: [facility];  Service: General;  Laterality: N/A;  ? s/p deviated nasal septum  2001  ? s/p uvulopaltoplasty  2001  ? VASECTOMY    ? Dr. Janice Norrie  ? ? reports that he has never smoked. He  has never used smokeless tobacco. He reports that he does not currently use alcohol. He reports that he does not use drugs. ?family history includes Cancer in his sister; Glaucoma in his mother; Heart attack (age of onset: 45) in his father; Hypertension in his mother; Stroke (age of onset: 47) in his mother. ?No Known Allergies ?Current Outpatient Medications on File Prior to Visit  ?Medication Sig Dispense Refill  ? aspirin EC 81 MG tablet Take 1 tablet (81 mg total) by mouth daily. 90 tablet 3  ? meloxicam (MOBIC) 15 MG tablet TAKE 1 TABLET(15 MG) BY MOUTH DAILY AS NEEDED FOR PAIN 14 tablet 0  ? ?No current facility-administered medications on file prior to visit.  ? ?     ROS:  All others reviewed and negative. ? ?Objective  ? ?     PE:  BP (!) 146/80 (BP Location: Right Arm, Patient Position: Sitting, Cuff Size: Large)   Pulse 67   Ht '6\' 2"'$  (1.88 m)   Wt 243 lb (110.2 kg)   SpO2 98%   BMI 31.20 kg/m?  ? ?              Constitutional: Pt appears in NAD ?  HENT: Head: NCAT.  ?              Right Ear: External ear normal.   ?              Left Ear: External ear normal.  ?              Eyes: . Pupils are equal, round, and reactive to light. Conjunctivae and EOM are normal ?              Nose: without d/c or deformity ?              Neck: Neck supple. Gross normal ROM ?              Cardiovascular: Normal rate and regular rhythm.   ?              Pulmonary/Chest: Effort normal and breath sounds without rales or wheezing.  ?              Abd:  Soft, NT, ND, + BS, no organomegaly ?              Neurological: Pt is alert. At baseline orientation, motor grossly intact ?              Skin: Skin is warm. No rashes, no other new lesions, LE edema - none ?              Psychiatric: Pt behavior is normal without agitation  ? ?Micro: none ? ?Cardiac tracings I have personally interpreted today:  none ? ?Pertinent Radiological findings (summarize): none  ? ?Lab Results  ?Component Value Date  ? WBC 10.0  09/23/2021  ? HGB 15.1 09/23/2021  ? HCT 46.7 09/23/2021  ? PLT 254.0 09/23/2021  ? GLUCOSE 95 09/23/2021  ? CHOL 213 (H) 09/23/2021  ? TRIG 105.0 09/23/2021  ? HDL 51.70 09/23/2021  ? LDLDIRECT 219.2 01/14/2012  ? LDLCALC 141 (H) 09/23/2021  ? ALT 13 09/23/2021  ? AST 15 09/23/2021  ? NA 139 09/23/2021  ? K 4.1 09/23/2021  ? CL 102 09/23/2021  ? CREATININE 1.52 (H) 09/23/2021  ? BUN 19 09/23/2021  ? CO2 28 09/23/2021  ? TSH 1.20 09/23/2021  ? PSA 1.37 09/23/2021  ? INR 1.0 01/21/2011  ? HGBA1C 5.8 09/23/2021  ? ?Assessment/Plan:  ?Alejandro Middleton is a 61 y.o. Black or African American [2] male with  has a past medical history of Erectile dysfunction, History of renal stone (01/14/2012), Hyperlipidemia, Hypertension, Inguinal hernia, and OSA (obstructive sleep apnea). ? ?Vitamin D deficiency ?Last vitamin D ?Lab Results  ?Component Value Date  ? VD25OH 15.08 (L) 05/08/2019  ? ?Low, to start oral replacement ? ? ?Encounter for well adult exam with abnormal findings ?Age and sex appropriate education and counseling updated with regular exercise and diet ?Referrals for preventative services - declines colonoscopy ?Immunizations addressed - decliens shingrix ?Smoking counseling  - none needed ?Evidence for depression or other mood disorder - none significant ?Most recent labs reviewed. ?I have personally reviewed and have noted: ?1) the patient's medical and social history ?2) The patient's current medications and supplements ?3) The patient's height, weight, and BMI have been recorded in the chart ? ? ?Hyperlipidemia ?Lab Results  ?Component Value Date  ? LDLCALC 141 (H) 09/23/2021  ? ?Severe uncontrolled, to restart crestor, low chol diet and for cardiac CT score ? ? ?Essential hypertension ?BP Readings from Last 3 Encounters:  ?09/23/21 (!) 146/80  ?05/08/19  130/84  ?05/02/19 (!) 160/90  ? ?uncontrolled, pt to restart norvasc, hyzaar ? ?Followup: Return in about 6 months (around 03/26/2022). ? ?Cathlean Cower, MD 09/30/2021  8:41 PM ?[place] [facility] ?

## 2021-09-30 ENCOUNTER — Encounter: Payer: Self-pay | Admitting: Internal Medicine

## 2021-09-30 NOTE — Assessment & Plan Note (Addendum)
Lab Results  ?Component Value Date  ? LDLCALC 141 (H) 09/23/2021  ? ?Severe uncontrolled, to restart crestor, low chol diet and for cardiac CT score ? ?

## 2021-09-30 NOTE — Assessment & Plan Note (Signed)
Age and sex appropriate education and counseling updated with regular exercise and diet ?Referrals for preventative services - declines colonoscopy ?Immunizations addressed - decliens shingrix ?Smoking counseling  - none needed ?Evidence for depression or other mood disorder - none significant ?Most recent labs reviewed. ?I have personally reviewed and have noted: ?1) the patient's medical and social history ?2) The patient's current medications and supplements ?3) The patient's height, weight, and BMI have been recorded in the chart ? ?

## 2021-09-30 NOTE — Assessment & Plan Note (Signed)
BP Readings from Last 3 Encounters:  ?09/23/21 (!) 146/80  ?05/08/19 130/84  ?05/02/19 (!) 160/90  ? ?uncontrolled, pt to restart norvasc, hyzaar ? ?

## 2021-11-11 ENCOUNTER — Ambulatory Visit: Admission: RE | Admit: 2021-11-11 | Payer: Self-pay | Source: Ambulatory Visit

## 2021-11-18 ENCOUNTER — Ambulatory Visit
Admission: RE | Admit: 2021-11-18 | Discharge: 2021-11-18 | Disposition: A | Discharge status: Home / Self Care | Payer: Self-pay | Source: Ambulatory Visit | Attending: Internal Medicine | Admitting: Internal Medicine

## 2021-11-18 DIAGNOSIS — I10 Essential (primary) hypertension: Secondary | ICD-10-CM

## 2021-11-18 DIAGNOSIS — E7800 Pure hypercholesterolemia, unspecified: Secondary | ICD-10-CM

## 2022-03-26 ENCOUNTER — Ambulatory Visit: Payer: 59 | Admitting: Internal Medicine

## 2022-03-26 VITALS — BP 130/78 | HR 75 | Temp 98.4°F | Ht 74.0 in | Wt 243.0 lb

## 2022-03-26 DIAGNOSIS — R739 Hyperglycemia, unspecified: Secondary | ICD-10-CM | POA: Diagnosis not present

## 2022-03-26 DIAGNOSIS — E785 Hyperlipidemia, unspecified: Secondary | ICD-10-CM | POA: Diagnosis not present

## 2022-03-26 DIAGNOSIS — I10 Essential (primary) hypertension: Secondary | ICD-10-CM | POA: Diagnosis not present

## 2022-03-26 DIAGNOSIS — F5101 Primary insomnia: Secondary | ICD-10-CM | POA: Diagnosis not present

## 2022-03-26 DIAGNOSIS — E559 Vitamin D deficiency, unspecified: Secondary | ICD-10-CM | POA: Diagnosis not present

## 2022-03-26 LAB — HEPATIC FUNCTION PANEL
ALT: 18 U/L (ref 0–53)
AST: 18 U/L (ref 0–37)
Albumin: 4.7 g/dL (ref 3.5–5.2)
Alkaline Phosphatase: 92 U/L (ref 39–117)
Bilirubin, Direct: 0.3 mg/dL (ref 0.0–0.3)
Total Bilirubin: 1.1 mg/dL (ref 0.2–1.2)
Total Protein: 7.7 g/dL (ref 6.0–8.3)

## 2022-03-26 LAB — LIPID PANEL
Cholesterol: 126 mg/dL (ref 0–200)
HDL: 45 mg/dL (ref >39.00)
LDL Cholesterol: 67 mg/dL (ref 0–99)
NonHDL: 80.73
Total CHOL/HDL Ratio: 3
Triglycerides: 70 mg/dL (ref 0.0–149.0)
VLDL: 14 mg/dL (ref 0.0–40.0)

## 2022-03-26 LAB — BASIC METABOLIC PANEL
BUN: 14 mg/dL (ref 6–23)
CO2: 30 mEq/L (ref 19–32)
Calcium: 9.8 mg/dL (ref 8.4–10.5)
Chloride: 99 mEq/L (ref 96–112)
Creatinine, Ser: 1.12 mg/dL (ref 0.40–1.50)
GFR: 70.06 mL/min (ref >60.00)
Glucose, Bld: 100 mg/dL — ABNORMAL HIGH (ref 70–99)
Potassium: 3.6 mEq/L (ref 3.5–5.1)
Sodium: 135 mEq/L (ref 135–145)

## 2022-03-26 LAB — VITAMIN D 25 HYDROXY (VIT D DEFICIENCY, FRACTURES): VITD: 40.26 ng/mL (ref 30.00–100.00)

## 2022-03-26 LAB — HEMOGLOBIN A1C: Hgb A1c MFr Bld: 6.3 % (ref 4.6–6.5)

## 2022-03-26 NOTE — Assessment & Plan Note (Signed)
Last vitamin D Lab Results  Component Value Date   VD25OH 20.28 (L) 09/23/2021   Low, to start oral replacement

## 2022-03-26 NOTE — Progress Notes (Signed)
Patient ID: Alejandro Middleton, male   DOB: [birth year], 61 y.o.   MRN: [id number]        Chief Complaint: follow up HTN, HLD and hyperglycemia , low vit d       HPI:  Alejandro Middleton is a 61 y.o. male here overall doing ok, Pt denies chest pain, increased sob or doe, wheezing, orthopnea, PND, increased LE swelling, palpitations, dizziness or syncope.   Pt denies polydipsia, polyuria, or new focal neuro s/s.    Pt denies fever, wt loss, night sweats, loss of appetite, or other constitutional symptoms   Has sched colonoscopy for mon nov 6.  Unable to sleep well recently , Denies worsening depressive symptoms, suicidal ideation, or panic       Wt Readings from Last 3 Encounters:  03/26/22 243 lb (110.2 kg)  09/23/21 243 lb (110.2 kg)  05/08/19 237 lb (107.5 kg)   BP Readings from Last 3 Encounters:  03/26/22 130/78  09/23/21 (!) 146/80  05/08/19 130/84         Past Medical History:  Diagnosis Date   Erectile dysfunction    History of renal stone 01/14/2012   Hyperlipidemia    Hypertension    Inguinal hernia    bilateral   OSA (obstructive sleep apnea)    s/p surgery   Past Surgical History:  Procedure Laterality Date   INGUINAL HERNIA REPAIR N/A 05/02/2019   Procedure: LAPAROSCOPIC BILATERAL INGUINAL HERNIA REPAIR WITH MESH, PRIMARY UMBILICAL HERNIA REPAIR;  Surgeon: Michael Boston, MD;  Location: [facility];  Service: General;  Laterality: N/A;   s/p deviated nasal septum  2001   s/p uvulopaltoplasty  2001   VASECTOMY     Dr. Janice Norrie    reports that he has never smoked. He has never used smokeless tobacco. He reports that he does not currently use alcohol. He reports that he does not use drugs. family history includes Cancer in his sister; Glaucoma in his mother; Heart attack (age of onset: 31) in his father; Hypertension in his mother; Stroke (age of onset: 56) in his mother. No Known Allergies Current Outpatient Medications on File Prior to Visit  Medication Sig  Dispense Refill   amLODipine (NORVASC) 10 MG tablet Take 1 tablet (10 mg total) by mouth daily. 90 tablet 3   aspirin EC 81 MG tablet Take 1 tablet (81 mg total) by mouth daily. 90 tablet 3   HYDROcodone-acetaminophen (NORCO) 10-325 MG tablet Take 1 tablet by mouth every 6 (six) hours as needed.     losartan-hydrochlorothiazide (HYZAAR) 100-12.5 MG tablet Take 1 tablet by mouth daily. 90 tablet 3   meloxicam (MOBIC) 15 MG tablet TAKE 1 TABLET(15 MG) BY MOUTH DAILY AS NEEDED FOR PAIN 14 tablet 0   rosuvastatin (CRESTOR) 40 MG tablet TAKE 1 TABLET(40 MG) BY MOUTH DAILY 90 tablet 3   No current facility-administered medications on file prior to visit.        ROS:  All others reviewed and negative.  Objective        PE:  BP 130/78 (BP Location: Right Arm, Patient Position: Sitting, Cuff Size: Large)   Pulse 75   Temp 98.4 F (36.9 C) (Oral)   Ht '6\' 2"'$  (1.88 m)   Wt 243 lb (110.2 kg)   SpO2 99%   BMI 31.20 kg/m                 Constitutional: Pt appears in NAD  HENT: Head: NCAT.                Right Ear: External ear normal.                 Left Ear: External ear normal.                Eyes: . Pupils are equal, round, and reactive to light. Conjunctivae and EOM are normal               Nose: without d/c or deformity               Neck: Neck supple. Gross normal ROM               Cardiovascular: Normal rate and regular rhythm.                 Pulmonary/Chest: Effort normal and breath sounds without rales or wheezing.                Abd:  Soft, NT, ND, + BS, no organomegaly               Neurological: Pt is alert. At baseline orientation, motor grossly intact               Skin: Skin is warm. No rashes, no other new lesions, LE edema - none               Psychiatric: Pt behavior is normal without agitation   Micro: none  Cardiac tracings I have personally interpreted today:  none  Pertinent Radiological findings (summarize): none   Lab Results  Component Value Date    WBC 10.0 09/23/2021   HGB 15.1 09/23/2021   HCT 46.7 09/23/2021   PLT 254.0 09/23/2021   GLUCOSE 100 (H) 03/26/2022   CHOL 126 03/26/2022   TRIG 70.0 03/26/2022   HDL 45.00 03/26/2022   LDLDIRECT 219.2 01/14/2012   LDLCALC 67 03/26/2022   ALT 18 03/26/2022   AST 18 03/26/2022   NA 135 03/26/2022   K 3.6 03/26/2022   CL 99 03/26/2022   CREATININE 1.12 03/26/2022   BUN 14 03/26/2022   CO2 30 03/26/2022   TSH 1.20 09/23/2021   PSA 1.37 09/23/2021   INR 1.0 01/21/2011   HGBA1C 6.3 03/26/2022   Assessment/Plan:  Alejandro Middleton is a 61 y.o. Black or African American [2] male with  has a past medical history of Erectile dysfunction, History of renal stone (01/14/2012), Hyperlipidemia, Hypertension, Inguinal hernia, and OSA (obstructive sleep apnea).  Vitamin D deficiency Last vitamin D Lab Results  Component Value Date   VD25OH 20.28 (L) 09/23/2021   Low, to start oral replacement   Hyperlipidemia Lab Results  Component Value Date   LDLCALC 67 03/26/2022   Stable, pt to continue current statin crestor 40 mg qd   Essential hypertension BP Readings from Last 3 Encounters:  03/26/22 130/78  09/23/21 (!) 146/80  05/08/19 130/84   Stable, pt to continue medical treatment norvasc 10 qd, hyzaar 100 - 12.5 qd   Insomnia Recent onset, for otc melatonin up to 10 mg qhs prn  Followup: Return in about 6 months (around 09/24/2022).  Cathlean Cower, MD 03/28/2022 8:23 PM [facility]

## 2022-03-26 NOTE — Patient Instructions (Addendum)
OK to try the OTC Melatonin up to 10 mg at bedtime for sleep  Please have your Shingrix (shingles) shots done at your local pharmacy.  Please continue all other medications as before, and refills have been done if requested.  Please have the pharmacy call with any other refills you may need.  Please continue your efforts at being more active, low cholesterol diet, and weight control.  Please keep your appointments with your specialists as you may have planned  Please go to the LAB at the blood drawing area for the tests to be done  You will be contacted by phone if any changes need to be made immediately.  Otherwise, you will receive a letter about your results with an explanation, but please check with MyChart first.  Please remember to sign up for MyChart if you have not done so, as this will be important to you in the future with finding out test results, communicating by private email, and scheduling acute appointments online when needed.  Please make an Appointment to return in 6 months, or sooner if needed

## 2022-03-28 ENCOUNTER — Encounter: Payer: Self-pay | Admitting: Internal Medicine

## 2022-03-28 DIAGNOSIS — G4700 Insomnia, unspecified: Secondary | ICD-10-CM | POA: Insufficient documentation

## 2022-03-28 NOTE — Assessment & Plan Note (Signed)
BP Readings from Last 3 Encounters:  03/26/22 130/78  09/23/21 (!) 146/80  05/08/19 130/84   Stable, pt to continue medical treatment norvasc 10 qd, hyzaar 100 - 12.5 qd

## 2022-03-28 NOTE — Assessment & Plan Note (Signed)
Lab Results  Component Value Date   LDLCALC 67 03/26/2022   Stable, pt to continue current statin crestor 40 mg qd

## 2022-03-28 NOTE — Assessment & Plan Note (Signed)
Recent onset, for otc melatonin up to 10 mg qhs prn

## 2022-04-01 DIAGNOSIS — M722 Plantar fascial fibromatosis: Secondary | ICD-10-CM | POA: Insufficient documentation

## 2022-09-11 ENCOUNTER — Other Ambulatory Visit: Payer: Self-pay

## 2022-09-11 MED ORDER — AMLODIPINE BESYLATE 10 MG PO TABS: 10.0000 mg | ORAL_TABLET | Freq: Every day | ORAL | 3 refills | Status: DC

## 2022-09-28 ENCOUNTER — Encounter: Payer: Self-pay | Admitting: Internal Medicine

## 2022-09-28 ENCOUNTER — Ambulatory Visit: Payer: 59 | Admitting: Internal Medicine

## 2022-09-28 VITALS — BP 120/72 | HR 67 | Temp 98.3°F | Ht 74.0 in | Wt 247.0 lb

## 2022-09-28 DIAGNOSIS — E538 Deficiency of other specified B group vitamins: Secondary | ICD-10-CM

## 2022-09-28 DIAGNOSIS — Z20828 Contact with and (suspected) exposure to other viral communicable diseases: Secondary | ICD-10-CM

## 2022-09-28 DIAGNOSIS — E785 Hyperlipidemia, unspecified: Secondary | ICD-10-CM

## 2022-09-28 DIAGNOSIS — I10 Essential (primary) hypertension: Secondary | ICD-10-CM

## 2022-09-28 DIAGNOSIS — Z125 Encounter for screening for malignant neoplasm of prostate: Secondary | ICD-10-CM | POA: Diagnosis not present

## 2022-09-28 DIAGNOSIS — R739 Hyperglycemia, unspecified: Secondary | ICD-10-CM

## 2022-09-28 DIAGNOSIS — Z0001 Encounter for general adult medical examination with abnormal findings: Secondary | ICD-10-CM

## 2022-09-28 DIAGNOSIS — J309 Allergic rhinitis, unspecified: Secondary | ICD-10-CM | POA: Diagnosis not present

## 2022-09-28 DIAGNOSIS — E559 Vitamin D deficiency, unspecified: Secondary | ICD-10-CM

## 2022-09-28 LAB — MICROALBUMIN / CREATININE URINE RATIO
Creatinine,U: 78.7 mg/dL
Microalb Creat Ratio: 0.9 mg/g (ref 0.0–30.0)
Microalb, Ur: <0.7 mg/dL (ref 0.0–1.9)

## 2022-09-28 LAB — PSA: PSA: 1.79 ng/mL (ref 0.10–4.00)

## 2022-09-28 LAB — CBC WITH DIFFERENTIAL/PLATELET
Basophils Absolute: 0 10*3/uL (ref 0.0–0.1)
Basophils Relative: 0.4 % (ref 0.0–3.0)
Eosinophils Absolute: 0.1 10*3/uL (ref 0.0–0.7)
Eosinophils Relative: 1 % (ref 0.0–5.0)
HCT: 43.9 % (ref 39.0–52.0)
Hemoglobin: 14.5 g/dL (ref 13.0–17.0)
Lymphocytes Relative: 20.7 % (ref 12.0–46.0)
Lymphs Abs: 2 10*3/uL (ref 0.7–4.0)
MCHC: 32.9 g/dL (ref 30.0–36.0)
MCV: 82 fl (ref 78.0–100.0)
Monocytes Absolute: 0.8 10*3/uL (ref 0.1–1.0)
Monocytes Relative: 8.1 % (ref 3.0–12.0)
Neutro Abs: 6.7 10*3/uL (ref 1.4–7.7)
Neutrophils Relative %: 69.8 % (ref 43.0–77.0)
Platelets: 262 10*3/uL (ref 150.0–400.0)
RBC: 5.36 Mil/uL (ref 4.22–5.81)
RDW: 14.9 % (ref 11.5–15.5)
WBC: 9.5 10*3/uL (ref 4.0–10.5)

## 2022-09-28 LAB — URINALYSIS, ROUTINE W REFLEX MICROSCOPIC
Bilirubin Urine: NEGATIVE
Hgb urine dipstick: NEGATIVE
Ketones, ur: NEGATIVE
Leukocytes,Ua: NEGATIVE
Nitrite: NEGATIVE
RBC / HPF: NONE SEEN (ref 0–?)
Specific Gravity, Urine: 1.015 (ref 1.000–1.030)
Total Protein, Urine: NEGATIVE
Urine Glucose: NEGATIVE
Urobilinogen, UA: 0.2 (ref 0.0–1.0)
pH: 6.5 (ref 5.0–8.0)

## 2022-09-28 LAB — HEPATIC FUNCTION PANEL
ALT: 24 U/L (ref 0–53)
AST: 17 U/L (ref 0–37)
Albumin: 4.7 g/dL (ref 3.5–5.2)
Alkaline Phosphatase: 90 U/L (ref 39–117)
Bilirubin, Direct: 0.2 mg/dL (ref 0.0–0.3)
Total Bilirubin: 1 mg/dL (ref 0.2–1.2)
Total Protein: 7.1 g/dL (ref 6.0–8.3)

## 2022-09-28 LAB — LIPID PANEL
Cholesterol: 150 mg/dL (ref 0–200)
HDL: 46.1 mg/dL (ref >39.00)
LDL Cholesterol: 87 mg/dL (ref 0–99)
NonHDL: 103.65
Total CHOL/HDL Ratio: 3
Triglycerides: 84 mg/dL (ref 0.0–149.0)
VLDL: 16.8 mg/dL (ref 0.0–40.0)

## 2022-09-28 LAB — BASIC METABOLIC PANEL
BUN: 15 mg/dL (ref 6–23)
CO2: 29 mEq/L (ref 19–32)
Calcium: 10.3 mg/dL (ref 8.4–10.5)
Chloride: 103 mEq/L (ref 96–112)
Creatinine, Ser: 1.28 mg/dL (ref 0.40–1.50)
GFR: 59.48 mL/min — ABNORMAL LOW (ref >60.00)
Glucose, Bld: 67 mg/dL — ABNORMAL LOW (ref 70–99)
Potassium: 4.6 mEq/L (ref 3.5–5.1)
Sodium: 142 mEq/L (ref 135–145)

## 2022-09-28 LAB — HEMOGLOBIN A1C: Hgb A1c MFr Bld: 6.4 % (ref 4.6–6.5)

## 2022-09-28 LAB — VITAMIN B12: Vitamin B-12: 195 pg/mL — ABNORMAL LOW (ref 211–911)

## 2022-09-28 LAB — TSH: TSH: 1.41 u[IU]/mL (ref 0.35–5.50)

## 2022-09-28 LAB — VITAMIN D 25 HYDROXY (VIT D DEFICIENCY, FRACTURES): VITD: 40.71 ng/mL (ref 30.00–100.00)

## 2022-09-28 NOTE — Patient Instructions (Signed)

## 2022-09-28 NOTE — Progress Notes (Unsigned)
Patient ID: Alejandro Middleton, male   DOB: 03/19/1961, 62 y.o.   MRN: [id number]         Chief Complaint:: wellness exam and htn, hld, low vit d, low b12, allergies,        HPI:  Alejandro Middleton is a 62 y.o. male here for wellness exam; has colonoscopy for June 2024, for tdap and shingrix at pharmacy, o/w up to date                     Also .Does have several wks ongoing nasal allergy symptoms with clearish congestion, itch and sneezing, without fever, pain, ST, cough, swelling or wheezing.  Wt gained 4 lbs despite walking 2-3 miles every day.  Pt denies chest pain, increased sob or doe, wheezing, orthopnea, PND, increased LE swelling, palpitations, dizziness or syncope.   Pt denies polydipsia, polyuria, or new focal neuro s/s.    Pt denies fever, wt loss, night sweats, loss of appetite, or other constitutional symptoms    Wt Readings from Last 3 Encounters:  09/28/22 247 lb (112 kg)  03/26/22 243 lb (110.2 kg)  09/23/21 243 lb (110.2 kg)   BP Readings from Last 3 Encounters:  09/28/22 120/72  03/26/22 130/78  09/23/21 (!) 146/80   Immunization History  Administered Date(s) Administered   PFIZER(Purple Top)SARS-COV-2 Vaccination 08/10/2019, 08/31/2019, 05/13/2020   Pneumococcal Polysaccharide-23 05/25/2001   Td 05/25/2001   Tdap 01/14/2012   Health Maintenance Due  Topic Date Due   DTaP/Tdap/Td (3 - Td or Tdap) 01/13/2022      Past Medical History:  Diagnosis Date   Erectile dysfunction    History of renal stone 01/14/2012   Hyperlipidemia    Hypertension    Inguinal hernia    bilateral   OSA (obstructive sleep apnea)    s/p surgery   Past Surgical History:  Procedure Laterality Date   INGUINAL HERNIA REPAIR N/A 05/02/2019   Procedure: LAPAROSCOPIC BILATERAL INGUINAL HERNIA REPAIR WITH MESH, PRIMARY UMBILICAL HERNIA REPAIR;  Surgeon: Karie Soda, MD;  Location: [facility];  Service: General;  Laterality: N/A;   s/p deviated nasal septum  2001   s/p  uvulopaltoplasty  2001   VASECTOMY     Dr. Brunilda Payor    reports that he has never smoked. He has never used smokeless tobacco. He reports that he does not currently use alcohol. He reports that he does not use drugs. family history includes Cancer in his sister; Glaucoma in his mother; Heart attack (age of onset: 2) in his father; Hypertension in his mother; Stroke (age of onset: 56) in his mother. No Known Allergies Current Outpatient Medications on File Prior to Visit  Medication Sig Dispense Refill   amLODipine (NORVASC) 10 MG tablet Take 1 tablet (10 mg total) by mouth daily. 90 tablet 3   aspirin EC 81 MG tablet Take 1 tablet (81 mg total) by mouth daily. 90 tablet 3   HYDROcodone-acetaminophen (NORCO) 10-325 MG tablet Take 1 tablet by mouth every 6 (six) hours as needed.     losartan-hydrochlorothiazide (HYZAAR) 100-12.5 MG tablet Take 1 tablet by mouth daily. 90 tablet 3   meloxicam (MOBIC) 15 MG tablet TAKE 1 TABLET(15 MG) BY MOUTH DAILY AS NEEDED FOR PAIN 14 tablet 0   rosuvastatin (CRESTOR) 40 MG tablet TAKE 1 TABLET(40 MG) BY MOUTH DAILY 90 tablet 3   No current facility-administered medications on file prior to visit.        ROS:  All  others reviewed and negative.  Objective        PE:  BP 120/72 (BP Location: Right Arm, Patient Position: Sitting, Cuff Size: Normal)   Pulse 67   Temp 98.3 F (36.8 C) (Oral)   Ht 6\' 2"  (1.88 m)   Wt 247 lb (112 kg)   SpO2 99%   BMI 31.71 kg/m                 Constitutional: Pt appears in NAD               HENT: Head: NCAT.                Right Ear: External ear normal.                 Left Ear: External ear normal.                Eyes: . Pupils are equal, round, and reactive to light. Conjunctivae and EOM are normal               Nose: without d/c or deformity               Neck: Neck supple. Gross normal ROM               Cardiovascular: Normal rate and regular rhythm.                 Pulmonary/Chest: Effort normal and breath sounds  without rales or wheezing.                Abd:  Soft, NT, ND, + BS, no organomegaly               Neurological: Pt is alert. At baseline orientation, motor grossly intact               Skin: Skin is warm. No rashes, no other new lesions, LE edema - none               Psychiatric: Pt behavior is normal without agitation   Micro: none  Cardiac tracings I have personally interpreted today:  none  Pertinent Radiological findings (summarize): none   Lab Results  Component Value Date   WBC 9.5 09/28/2022   HGB 14.5 09/28/2022   HCT 43.9 09/28/2022   PLT 262.0 09/28/2022   GLUCOSE 67 (L) 09/28/2022   CHOL 150 09/28/2022   TRIG 84.0 09/28/2022   HDL 46.10 09/28/2022   LDLDIRECT 219.2 01/14/2012   LDLCALC 87 09/28/2022   ALT 24 09/28/2022   AST 17 09/28/2022   NA 142 09/28/2022   K 4.6 09/28/2022   CL 103 09/28/2022   CREATININE 1.28 09/28/2022   BUN 15 09/28/2022   CO2 29 09/28/2022   TSH 1.41 09/28/2022   PSA 1.79 09/28/2022   INR 1.0 01/21/2011   HGBA1C 6.4 09/28/2022   MICROALBUR <0.7 09/28/2022   Assessment/Plan:  Alejandro Middleton is a 62 y.o. Black or African American [2] male with  has a past medical history of Erectile dysfunction, History of renal stone (01/14/2012), Hyperlipidemia, Hypertension, Inguinal hernia, and OSA (obstructive sleep apnea).  Encounter for well adult exam with abnormal findings Age and sex appropriate education and counseling updated with regular exercise and diet Referrals for preventative services - for colonoscopy June 2024 scheduled Immunizations addressed - for tdap and shingrix at pharmacy Smoking counseling  - none needed Evidence for depression or other mood disorder - none significant Most recent labs reviewed.  I have personally reviewed and have noted: 1) the patient's medical and social history 2) The patient's current medications and supplements 3) The patient's height, weight, and BMI have been recorded in the chart   B12  deficiency Lab Results  Component Value Date   VITAMINB12 195 (L) 09/28/2022   Low, to start oral replacement - b12 1000 mcg qd   Essential hypertension BP Readings from Last 3 Encounters:  09/28/22 120/72  03/26/22 130/78  09/23/21 (!) 146/80   Stable, pt to continue medical treatment amlodipine 10 qd, hyzaar 100-12.5 qd   Hyperlipidemia Lab Results  Component Value Date   LDLCALC 87 09/28/2022   Stable, pt to continue current statin crestor 40 mg qd   Vitamin D deficiency Last vitamin D Lab Results  Component Value Date   VD25OH 40.71 09/28/2022   Stable, cont oral replacement   Allergic rhinitis Mild to mod, for add zyrtec 10 qd, and continue nasacort asd,  to f/u any worsening symptoms or concerns  Followup: Return in about 6 months (around 03/31/2023).  Oliver Barre, MD 09/29/2022 7:53 PM [facility]

## 2022-09-29 ENCOUNTER — Encounter: Payer: Self-pay | Admitting: Internal Medicine

## 2022-09-29 DIAGNOSIS — J309 Allergic rhinitis, unspecified: Secondary | ICD-10-CM | POA: Insufficient documentation

## 2022-09-29 LAB — VARICELLA ZOSTER ANTIBODY, IGG: Varicella IgG: 1463 index

## 2022-09-29 NOTE — Assessment & Plan Note (Signed)
Mild to mod, for add zyrtec 10 qd, and continue nasacort asd,  to f/u any worsening symptoms or concerns

## 2022-09-29 NOTE — Assessment & Plan Note (Signed)
Lab Results  Component Value Date   LDLCALC 87 09/28/2022   Stable, pt to continue current statin crestor 40 mg qd

## 2022-09-29 NOTE — Assessment & Plan Note (Signed)
Lab Results  Component Value Date   VITAMINB12 195 (L) 09/28/2022   Low, to start oral replacement - b12 1000 mcg qd

## 2022-09-29 NOTE — Assessment & Plan Note (Signed)
BP Readings from Last 3 Encounters:  09/28/22 120/72  03/26/22 130/78  09/23/21 (!) 146/80   Stable, pt to continue medical treatment amlodipine 10 qd, hyzaar 100-12.5 qd

## 2022-09-29 NOTE — Assessment & Plan Note (Signed)
Age and sex appropriate education and counseling updated with regular exercise and diet Referrals for preventative services - for colonoscopy June 2024 scheduled Immunizations addressed - for tdap and shingrix at pharmacy Smoking counseling  - none needed Evidence for depression or other mood disorder - none significant Most recent labs reviewed. I have personally reviewed and have noted: 1) the patient's medical and social history 2) The patient's current medications and supplements 3) The patient's height, weight, and BMI have been recorded in the chart

## 2022-09-29 NOTE — Assessment & Plan Note (Signed)
Last vitamin D Lab Results  Component Value Date   VD25OH 40.71 09/28/2022   Stable, cont oral replacement

## 2022-10-14 ENCOUNTER — Other Ambulatory Visit: Payer: Self-pay

## 2022-10-14 ENCOUNTER — Telehealth: Payer: Self-pay | Admitting: Internal Medicine

## 2022-10-14 MED ORDER — LOSARTAN POTASSIUM-HCTZ 100-12.5 MG PO TABS: 1.0000 | ORAL_TABLET | Freq: Every day | ORAL | 3 refills | Status: DC

## 2022-10-14 MED ORDER — ROSUVASTATIN CALCIUM 40 MG PO TABS: ORAL_TABLET | ORAL | 3 refills | Status: DC

## 2022-10-14 NOTE — Telephone Encounter (Signed)
Caller & Relationship to patient: Nikil - patient  Call back number: [phone]  Date of last office visit: 09-28-22  Date of next office visit: 03/22/23  Medication(s) to be refilled:  losartan-hydrochlorothiazide (HYZAAR) 100-12.5 MG tablet   rosuvastatin (CRESTOR) 40 MG tablet       Preferred Pharmacy: [id number] - [place], [place] - [address] AT [facility] OF [address]

## 2022-10-14 NOTE — Telephone Encounter (Signed)
Refill Sent. 

## 2023-03-08 LAB — HM COLONOSCOPY

## 2023-03-22 ENCOUNTER — Encounter: Payer: Self-pay | Admitting: Internal Medicine

## 2023-03-22 ENCOUNTER — Ambulatory Visit: Payer: 59 | Admitting: Internal Medicine

## 2023-03-22 VITALS — BP 138/82 | HR 55 | Temp 98.2°F | Ht 74.0 in | Wt 249.0 lb

## 2023-03-22 DIAGNOSIS — R739 Hyperglycemia, unspecified: Secondary | ICD-10-CM

## 2023-03-22 DIAGNOSIS — I10 Essential (primary) hypertension: Secondary | ICD-10-CM | POA: Diagnosis not present

## 2023-03-22 DIAGNOSIS — E559 Vitamin D deficiency, unspecified: Secondary | ICD-10-CM

## 2023-03-22 DIAGNOSIS — E538 Deficiency of other specified B group vitamins: Secondary | ICD-10-CM

## 2023-03-22 DIAGNOSIS — E785 Hyperlipidemia, unspecified: Secondary | ICD-10-CM

## 2023-03-22 DIAGNOSIS — Z125 Encounter for screening for malignant neoplasm of prostate: Secondary | ICD-10-CM

## 2023-03-22 MED ORDER — LOSARTAN POTASSIUM-HCTZ 100-12.5 MG PO TABS: 1.0000 | ORAL_TABLET | Freq: Every day | ORAL | 3 refills | Status: DC

## 2023-03-22 MED ORDER — ROSUVASTATIN CALCIUM 40 MG PO TABS: ORAL_TABLET | ORAL | 3 refills | Status: DC

## 2023-03-22 MED ORDER — AMLODIPINE BESYLATE 10 MG PO TABS: 10.0000 mg | ORAL_TABLET | Freq: Every day | ORAL | 3 refills | Status: DC

## 2023-03-22 NOTE — Progress Notes (Signed)
Patient ID: Alejandro Middleton, male   DOB: 06/14/1960, 62 y.o.   MRN: [id number]        Chief Complaint: follow up HTN, HLD and hyperglycemia , low b12       HPI:  Alejandro Middleton is a 62 y.o. male here overall doing ok,  Pt denies chest pain, increased sob or doe, wheezing, orthopnea, PND, increased LE swelling, palpitations, dizziness or syncope.   Pt denies polydipsia, polyuria, or new focal neuro s/s.    Pt denies fever, wt loss, night sweats, loss of appetite, or other constitutional symptoms    Wt Readings from Last 3 Encounters:  03/22/23 249 lb (112.9 kg)  09/28/22 247 lb (112 kg)  03/26/22 243 lb (110.2 kg)   BP Readings from Last 3 Encounters:  03/22/23 138/82  09/28/22 120/72  03/26/22 130/78         Past Medical History:  Diagnosis Date   Erectile dysfunction    History of renal stone 01/14/2012   Hyperlipidemia    Hypertension    Inguinal hernia    bilateral   OSA (obstructive sleep apnea)    s/p surgery   Past Surgical History:  Procedure Laterality Date   INGUINAL HERNIA REPAIR N/A 05/02/2019   Procedure: LAPAROSCOPIC BILATERAL INGUINAL HERNIA REPAIR WITH MESH, PRIMARY UMBILICAL HERNIA REPAIR;  Surgeon: Karie Soda, MD;  Location: [facility] [place];  Service: General;  Laterality: N/A;   s/p deviated nasal septum  2001   s/p uvulopaltoplasty  2001   VASECTOMY     Dr. Brunilda Payor    reports that he has never smoked. He has never used smokeless tobacco. He reports that he does not currently use alcohol. He reports that he does not use drugs. family history includes Cancer in his sister; Glaucoma in his mother; Heart attack (age of onset: 63) in his father; Hypertension in his mother; Stroke (age of onset: 12) in his mother. No Known Allergies Current Outpatient Medications on File Prior to Visit  Medication Sig Dispense Refill   aspirin EC 81 MG tablet Take 1 tablet (81 mg total) by mouth daily. 90 tablet 3   No current facility-administered medications on file  prior to visit.        ROS:  All others reviewed and negative.  Objective        PE:  BP 138/82 (BP Location: Left Arm, Patient Position: Sitting, Cuff Size: Normal)   Pulse (!) 55   Temp 98.2 F (36.8 C) (Oral)   Ht 6\' 2"  (1.88 m)   Wt 249 lb (112.9 kg)   SpO2 98%   BMI 31.97 kg/m                 Constitutional: Pt appears in NAD               HENT: Head: NCAT.                Right Ear: External ear normal.                 Left Ear: External ear normal.                Eyes: . Pupils are equal, round, and reactive to light. Conjunctivae and EOM are normal               Nose: without d/c or deformity               Neck: Neck supple. Gross normal ROM  Cardiovascular: Normal rate and regular rhythm.                 Pulmonary/Chest: Effort normal and breath sounds without rales or wheezing.                Abd:  Soft, NT, ND, + BS, no organomegaly               Neurological: Pt is alert. At baseline orientation, motor grossly intact               Skin: Skin is warm. No rashes, no other new lesions, LE edema - none               Psychiatric: Pt behavior is normal without agitation   Micro: none  Cardiac tracings I have personally interpreted today:  none  Pertinent Radiological findings (summarize): none   Lab Results  Component Value Date   WBC 9.5 09/28/2022   HGB 14.5 09/28/2022   HCT 43.9 09/28/2022   PLT 262.0 09/28/2022   GLUCOSE 67 (L) 09/28/2022   CHOL 150 09/28/2022   TRIG 84.0 09/28/2022   HDL 46.10 09/28/2022   LDLDIRECT 219.2 01/14/2012   LDLCALC 87 09/28/2022   ALT 24 09/28/2022   AST 17 09/28/2022   NA 142 09/28/2022   K 4.6 09/28/2022   CL 103 09/28/2022   CREATININE 1.28 09/28/2022   BUN 15 09/28/2022   CO2 29 09/28/2022   TSH 1.41 09/28/2022   PSA 1.79 09/28/2022   INR 1.0 01/21/2011   HGBA1C 6.4 09/28/2022   MICROALBUR <0.7 09/28/2022   Assessment/Plan:  Alejandro Middleton is a 62 y.o. Black or African American [2] male with  has a  past medical history of Erectile dysfunction, History of renal stone (01/14/2012), Hyperlipidemia, Hypertension, Inguinal hernia, and OSA (obstructive sleep apnea).  B12 deficiency Lab Results  Component Value Date   VITAMINB12 195 (L) 09/28/2022   Low, to start oral replacement - b12 1000 mcg qd   Vitamin D deficiency Last vitamin D Lab Results  Component Value Date   VD25OH 40.71 09/28/2022   Stable, cont oral replacement   Hyperlipidemia Lab Results  Component Value Date   LDLCALC 87 09/28/2022   Stable, pt to continue current statin crestor 40 qd   Essential hypertension BP Readings from Last 3 Encounters:  03/22/23 138/82  09/28/22 120/72  03/26/22 130/78   uncontrolled pt to continue medical treatment norvasc 10 mg and restart hyzaar 100 - 12.5 qd  Followup: Return in about 6 months (around 09/20/2023).  Oliver Barre, MD 03/26/2023 9:45 PM [facility] - [facility]

## 2023-03-22 NOTE — Assessment & Plan Note (Signed)
Lab Results  Component Value Date   VITAMINB12 195 (L) 09/28/2022   Low, to start oral replacement - b12 1000 mcg qd

## 2023-03-22 NOTE — Patient Instructions (Signed)
Please continue all other medications as before, and refills have been done if requested.  Please have the pharmacy call with any other refills you may need.  Please continue your efforts at being more active, low cholesterol diet, and weight control  Please keep your appointments with your specialists as you may have planned  We can hold on lab testing today  Please make an Appointment to return in 6 months, or sooner if needed, also with Lab Appointment for testing done 3-5 days before at the FIRST FLOOR Lab (so this is for TWO appointments - please see the scheduling desk as you leave)

## 2023-03-26 ENCOUNTER — Encounter: Payer: Self-pay | Admitting: Internal Medicine

## 2023-03-26 NOTE — Assessment & Plan Note (Addendum)
BP Readings from Last 3 Encounters:  03/22/23 138/82  09/28/22 120/72  03/26/22 130/78   uncontrolled pt to continue medical treatment norvasc 10 mg and restart hyzaar 100 - 12.5 qd

## 2023-03-26 NOTE — Assessment & Plan Note (Signed)
Lab Results  Component Value Date   LDLCALC 87 09/28/2022   Stable, pt to continue current statin crestor 40 qd

## 2023-03-26 NOTE — Assessment & Plan Note (Signed)
Last vitamin D Lab Results  Component Value Date   VD25OH 40.71 09/28/2022   Stable, cont oral replacement

## 2024-04-12 ENCOUNTER — Other Ambulatory Visit: Payer: Self-pay

## 2024-04-12 ENCOUNTER — Other Ambulatory Visit: Payer: Self-pay | Admitting: Internal Medicine

## 2024-04-26 ENCOUNTER — Ambulatory Visit: Admitting: Internal Medicine

## 2024-04-26 ENCOUNTER — Ambulatory Visit: Payer: Self-pay | Admitting: Internal Medicine

## 2024-04-26 ENCOUNTER — Encounter: Payer: Self-pay | Admitting: Internal Medicine

## 2024-04-26 VITALS — BP 142/80 | HR 71 | Temp 98.8°F | Ht 74.0 in | Wt 245.0 lb

## 2024-04-26 DIAGNOSIS — G8929 Other chronic pain: Secondary | ICD-10-CM

## 2024-04-26 DIAGNOSIS — E785 Hyperlipidemia, unspecified: Secondary | ICD-10-CM

## 2024-04-26 DIAGNOSIS — M545 Low back pain, unspecified: Secondary | ICD-10-CM | POA: Insufficient documentation

## 2024-04-26 DIAGNOSIS — Z125 Encounter for screening for malignant neoplasm of prostate: Secondary | ICD-10-CM

## 2024-04-26 DIAGNOSIS — R739 Hyperglycemia, unspecified: Secondary | ICD-10-CM

## 2024-04-26 DIAGNOSIS — E538 Deficiency of other specified B group vitamins: Secondary | ICD-10-CM

## 2024-04-26 DIAGNOSIS — E559 Vitamin D deficiency, unspecified: Secondary | ICD-10-CM

## 2024-04-26 DIAGNOSIS — Z0001 Encounter for general adult medical examination with abnormal findings: Secondary | ICD-10-CM

## 2024-04-26 DIAGNOSIS — I10 Essential (primary) hypertension: Secondary | ICD-10-CM

## 2024-04-26 LAB — CBC WITH DIFFERENTIAL/PLATELET
Basophils Absolute: 0 K/uL (ref 0.0–0.1)
Basophils Relative: 0.4 % (ref 0.0–3.0)
Eosinophils Absolute: 0.1 K/uL (ref 0.0–0.7)
Eosinophils Relative: 0.9 % (ref 0.0–5.0)
HCT: 45.2 % (ref 39.0–52.0)
Hemoglobin: 14.9 g/dL (ref 13.0–17.0)
Lymphocytes Relative: 16.7 % (ref 12.0–46.0)
Lymphs Abs: 1.4 K/uL (ref 0.7–4.0)
MCHC: 32.9 g/dL (ref 30.0–36.0)
MCV: 80.7 fl (ref 78.0–100.0)
Monocytes Absolute: 0.7 K/uL (ref 0.1–1.0)
Monocytes Relative: 8.7 % (ref 3.0–12.0)
Neutro Abs: 6.2 K/uL (ref 1.4–7.7)
Neutrophils Relative %: 73.3 % (ref 43.0–77.0)
Platelets: 233 K/uL (ref 150.0–400.0)
RBC: 5.6 Mil/uL (ref 4.22–5.81)
RDW: 16.2 % — ABNORMAL HIGH (ref 11.5–15.5)
WBC: 8.4 K/uL (ref 4.0–10.5)

## 2024-04-26 LAB — BASIC METABOLIC PANEL WITH GFR
BUN: 15 mg/dL (ref 6–23)
CO2: 27 meq/L (ref 19–32)
Calcium: 9.6 mg/dL (ref 8.4–10.5)
Chloride: 103 meq/L (ref 96–112)
Creatinine, Ser: 1.16 mg/dL (ref 0.40–1.50)
GFR: 66.2 mL/min (ref >60.00)
Glucose, Bld: 102 mg/dL — ABNORMAL HIGH (ref 70–99)
Potassium: 3.8 meq/L (ref 3.5–5.1)
Sodium: 140 meq/L (ref 135–145)

## 2024-04-26 LAB — URINALYSIS, ROUTINE W REFLEX MICROSCOPIC
Bilirubin Urine: NEGATIVE
Hgb urine dipstick: NEGATIVE
Ketones, ur: NEGATIVE
Leukocytes,Ua: NEGATIVE
Nitrite: NEGATIVE
RBC / HPF: NONE SEEN (ref 0–?)
Specific Gravity, Urine: 1.025 (ref 1.000–1.030)
Total Protein, Urine: NEGATIVE
Urine Glucose: NEGATIVE
Urobilinogen, UA: 0.2 (ref 0.0–1.0)
pH: 6 (ref 5.0–8.0)

## 2024-04-26 LAB — MICROALBUMIN / CREATININE URINE RATIO
Creatinine,U: 273.9 mg/dL
Microalb Creat Ratio: 7.1 mg/g (ref 0.0–30.0)
Microalb, Ur: 2 mg/dL — ABNORMAL HIGH (ref 0.0–1.9)

## 2024-04-26 LAB — HEPATIC FUNCTION PANEL
ALT: 18 U/L (ref 0–53)
AST: 15 U/L (ref 0–37)
Albumin: 4.5 g/dL (ref 3.5–5.2)
Alkaline Phosphatase: 86 U/L (ref 39–117)
Bilirubin, Direct: 0.2 mg/dL (ref 0.0–0.3)
Total Bilirubin: 1.9 mg/dL — ABNORMAL HIGH (ref 0.2–1.2)
Total Protein: 6.7 g/dL (ref 6.0–8.3)

## 2024-04-26 LAB — TSH: TSH: 0.99 u[IU]/mL (ref 0.35–5.50)

## 2024-04-26 LAB — LIPID PANEL
Cholesterol: 254 mg/dL — ABNORMAL HIGH (ref 0–200)
HDL: 43.6 mg/dL (ref >39.00)
LDL Cholesterol: 186 mg/dL — ABNORMAL HIGH (ref 0–99)
NonHDL: 210.31
Total CHOL/HDL Ratio: 6
Triglycerides: 120 mg/dL (ref 0.0–149.0)
VLDL: 24 mg/dL (ref 0.0–40.0)

## 2024-04-26 LAB — VITAMIN D 25 HYDROXY (VIT D DEFICIENCY, FRACTURES): VITD: 29.12 ng/mL — ABNORMAL LOW (ref 30.00–100.00)

## 2024-04-26 LAB — HEMOGLOBIN A1C: Hgb A1c MFr Bld: 6 % (ref 4.6–6.5)

## 2024-04-26 LAB — VITAMIN B12: Vitamin B-12: 216 pg/mL (ref 211–911)

## 2024-04-26 LAB — PSA: PSA: 1.71 ng/mL (ref 0.10–4.00)

## 2024-04-26 MED ORDER — ROSUVASTATIN CALCIUM 40 MG PO TABS: ORAL_TABLET | ORAL | 3 refills | Status: AC

## 2024-04-26 MED ORDER — LOSARTAN POTASSIUM-HCTZ 100-12.5 MG PO TABS: 1.0000 | ORAL_TABLET | Freq: Every day | ORAL | 3 refills | Status: AC

## 2024-04-26 MED ORDER — AMLODIPINE BESYLATE 10 MG PO TABS: 10.0000 mg | ORAL_TABLET | Freq: Every day | ORAL | 3 refills | Status: AC

## 2024-04-26 MED ORDER — CYCLOBENZAPRINE HCL 5 MG PO TABS: 5.0000 mg | ORAL_TABLET | Freq: Three times a day (TID) | ORAL | 1 refills | Status: AC | PRN

## 2024-04-26 NOTE — Progress Notes (Signed)
 Patient ID: Alejandro Middleton, male   DOB: [birth year], 63 y.o.   MRN: [id number]         Chief Complaint:: wellness exam and low b12 and D, htn, left lower back pain without sciatica, hld       HPI:  Alejandro Middleton is a 63 y.o. male here for wellness exam; declines all vax today; o/w up to date                Also has run out of hyzaar  for several months.  Pt denies chest pain, increased sob or doe, wheezing, orthopnea, PND, increased LE swelling, palpitations, dizziness or syncope.   Pt denies polydipsia, polyuria, or new focal neuro s/s.    Pt denies fever, wt loss, night sweats, loss of appetite, or other constitutional symptoms  Pt continues to have recurring left LBP without bowel or bladder change, fever, wt loss,  worsening LE pain/numbness/weakness, gait change or falls.Worse to stand or bend.     Wt Readings from Last 3 Encounters:  04/26/24 245 lb (111.1 kg)  03/22/23 249 lb (112.9 kg)  09/28/22 247 lb (112 kg)   BP Readings from Last 3 Encounters:  04/26/24 (!) 142/80  03/22/23 138/82  09/28/22 120/72   Immunization History  Administered Date(s) Administered   PFIZER(Purple Top)SARS-COV-2 Vaccination 08/10/2019, 08/31/2019, 05/13/2020   Pneumococcal Polysaccharide-23 05/25/2001   Td 05/25/2001   Tdap 01/14/2012   Health Maintenance Due  Topic Date Due   Medicare Annual Wellness (AWV)  Never done   Pneumococcal Vaccine: 50+ Years (2 of 2 - PCV) 01/08/2009   Zoster Vaccines- Shingrix (1 of 2) Never done   DTaP/Tdap/Td (3 - Td or Tdap) 01/13/2022   Influenza Vaccine  Never done      Past Medical History:  Diagnosis Date   Erectile dysfunction    History of renal stone 01/14/2012   Hyperlipidemia    Hypertension    Inguinal hernia    bilateral   OSA (obstructive sleep apnea)    s/p surgery   Past Surgical History:  Procedure Laterality Date   INGUINAL HERNIA REPAIR N/A 05/02/2019   Procedure: LAPAROSCOPIC BILATERAL INGUINAL HERNIA REPAIR WITH MESH, PRIMARY  UMBILICAL HERNIA REPAIR;  Surgeon: Sheldon Standing, MD;  Location: [facility];  Service: General;  Laterality: N/A;   s/p deviated nasal septum  2001   s/p uvulopaltoplasty  2001   VASECTOMY     Dr. Aleene    reports that he has never smoked. He has never used smokeless tobacco. He reports that he does not currently use alcohol. He reports that he does not use drugs. family history includes Cancer in his sister; Glaucoma in his mother; Heart attack (age of onset: 58) in his father; Hypertension in his mother; Stroke (age of onset: 74) in his mother. No Known Allergies Current Outpatient Medications on File Prior to Visit  Medication Sig Dispense Refill   aspirin  EC 81 MG tablet Take 1 tablet (81 mg total) by mouth daily. 90 tablet 3   No current facility-administered medications on file prior to visit.        ROS:  All others reviewed and negative.  Objective        PE:  BP (!) 142/80 (BP Location: Right Arm, Patient Position: Sitting, Cuff Size: Normal)   Pulse 71   Temp 98.8 F (37.1 C) (Oral)   Ht 6' 2 (1.88 m)   Wt 245 lb (111.1 kg)   SpO2 99%  BMI 31.46 kg/m                 Constitutional: Pt appears in NAD               HENT: Head: NCAT.                Right Ear: External ear normal.                 Left Ear: External ear normal.                Eyes: . Pupils are equal, round, and reactive to light. Conjunctivae and EOM are normal               Nose: without d/c or deformity               Neck: Neck supple. Gross normal ROM               Cardiovascular: Normal rate and regular rhythm.                 Pulmonary/Chest: Effort normal and breath sounds without rales or wheezing.                Abd:  Soft, NT, ND, + BS, no organomegaly               Neurological: Pt is alert. At baseline orientation, motor grossly intact               Skin: Skin is warm. No rashes, no other new lesions, LE edema - none               Psychiatric: Pt behavior is normal without  agitation   Micro: none  Cardiac tracings I have personally interpreted today:  none  Pertinent Radiological findings (summarize): none   Lab Results  Component Value Date   WBC 8.4 04/26/2024   HGB 14.9 04/26/2024   HCT 45.2 04/26/2024   PLT 233.0 04/26/2024   GLUCOSE 102 (H) 04/26/2024   CHOL 254 (H) 04/26/2024   TRIG 120.0 04/26/2024   HDL 43.60 04/26/2024   LDLDIRECT 219.2 01/14/2012   LDLCALC 186 (H) 04/26/2024   ALT 18 04/26/2024   AST 15 04/26/2024   NA 140 04/26/2024   K 3.8 04/26/2024   CL 103 04/26/2024   CREATININE 1.16 04/26/2024   BUN 15 04/26/2024   CO2 27 04/26/2024   TSH 0.99 04/26/2024   PSA 1.71 04/26/2024   INR 1.0 01/21/2011   HGBA1C 6.0 04/26/2024   MICROALBUR 2.0 (H) 04/26/2024   Assessment/Plan:  Alejandro Middleton is a 63 y.o. Black or African American [2] male with  has a past medical history of Erectile dysfunction, History of renal stone (01/14/2012), Hyperlipidemia, Hypertension, Inguinal hernia, and OSA (obstructive sleep apnea).  Encounter for well adult exam with abnormal findings Age and sex appropriate education and counseling updated with regular exercise and diet Referrals for preventative services - none needed Immunizations addressed - declines all vaccinations Smoking counseling  - none needed Evidence for depression or other mood disorder - none significant Most recent labs reviewed. I have personally reviewed and have noted: 1) the patient's medical and social history 2) The patient's current medications and supplements 3) The patient's height, weight, and BMI have been recorded in the chart   Vitamin D  deficiency Last vitamin D  Lab Results  Component Value Date   VD25OH 29.12 (L) 04/26/2024   Low, to start oral replacement  Hyperlipidemia Lab Results  Component Value Date   LDLCALC 186 (H) 04/26/2024   Uncontrolled, pt to restart crestor  40 mg   Essential hypertension BP Readings from Last 3 Encounters:   04/26/24 (!) 142/80  03/22/23 138/82  09/28/22 120/72   uncontrolled, pt to continue medical treatment amlodipine  10 mg but to restart hyzaar  100 12.5 qd   B12 deficiency Lab Results  Component Value Date   VITAMINB12 216 04/26/2024   Low, to start oral replacement - b12 1000 mcg qd   Low back pain C/w likely msk strain, for flexeril 5 tid prn,  to f/u any worsening symptoms or concerns  Followup: Return in about 6 months (around 10/25/2024).  Lynwood Rush, MD 04/26/2024 8:55 PM [facility] [place] [facility] - [facility]

## 2024-04-26 NOTE — Assessment & Plan Note (Signed)
 C/w likely msk strain, for flexeril 5 tid prn,  to f/u any worsening symptoms or concerns

## 2024-04-26 NOTE — Assessment & Plan Note (Signed)
 Last vitamin D  Lab Results  Component Value Date   VD25OH 29.12 (L) 04/26/2024   Low, to start oral replacement

## 2024-04-26 NOTE — Assessment & Plan Note (Signed)
Age and sex appropriate education and counseling updated with regular exercise and diet Referrals for preventative services - none needed Immunizations addressed - declines all vaccinations Smoking counseling  - none needed Evidence for depression or other mood disorder - none significant Most recent labs reviewed. I have personally reviewed and have noted: 1) the patient's medical and social history 2) The patient's current medications and supplements 3) The patient's height, weight, and BMI have been recorded in the chart

## 2024-04-26 NOTE — Assessment & Plan Note (Signed)
 Lab Results  Component Value Date   VITAMINB12 216 04/26/2024   Low, to start oral replacement - b12 1000 mcg qd

## 2024-04-26 NOTE — Assessment & Plan Note (Signed)
 BP Readings from Last 3 Encounters:  04/26/24 (!) 142/80  03/22/23 138/82  09/28/22 120/72   uncontrolled, pt to continue medical treatment amlodipine  10 mg but to restart hyzaar  100 12.5 qd

## 2024-04-26 NOTE — Patient Instructions (Addendum)
 Please continue all other medications as before, and refills have been done for all 3 medications  Please take all new medication as prescribed - the muscle relaxer for back muscle spasms  Please have the pharmacy call with any other refills you may need.  Please continue your efforts at being more active, low cholesterol diet, and weight control.  You are otherwise up to date with prevention measures today.  Please keep your appointments with your specialists as you may have planned  Please go to the LAB at the blood drawing area for the tests to be done  You will be contacted by phone if any changes need to be made immediately.  Otherwise, you will receive a letter about your results with an explanation, but please check with MyChart first.  Please make an Appointment to return in 6 months, or sooner if needed

## 2024-04-26 NOTE — Assessment & Plan Note (Signed)
 Lab Results  Component Value Date   LDLCALC 186 (H) 04/26/2024   Uncontrolled, pt to restart crestor  40 mg

## 2024-05-11 ENCOUNTER — Telehealth: Payer: Self-pay | Admitting: Internal Medicine

## 2024-05-11 NOTE — Telephone Encounter (Signed)
 Patient saw [facility] and they requested copies of his most recent blood work and urine samples from 04/26/24. He sees Dr. Morene Salines. Best callback is [phone].

## 2024-05-12 NOTE — Telephone Encounter (Signed)
 Labs have been faxed to [facility] today.

## 2024-06-16 ENCOUNTER — Telehealth: Payer: Self-pay | Admitting: Internal Medicine

## 2024-06-16 ENCOUNTER — Encounter (HOSPITAL_COMMUNITY): Payer: Self-pay | Admitting: Emergency Medicine

## 2024-06-16 ENCOUNTER — Ambulatory Visit (HOSPITAL_COMMUNITY)

## 2024-06-16 ENCOUNTER — Ambulatory Visit (HOSPITAL_COMMUNITY)
Admission: EM | Admit: 2024-06-16 | Discharge: 2024-06-16 | Disposition: A | Discharge status: Home / Self Care | Attending: Internal Medicine | Admitting: Internal Medicine

## 2024-06-16 DIAGNOSIS — S62639A Displaced fracture of distal phalanx of unspecified finger, initial encounter for closed fracture: Secondary | ICD-10-CM

## 2024-06-16 DIAGNOSIS — S60141A Contusion of right ring finger with damage to nail, initial encounter: Secondary | ICD-10-CM | POA: Diagnosis not present

## 2024-06-16 NOTE — Telephone Encounter (Signed)
 Patient would like to be taken off the amlodopine and put on another blood pressure medicine.  Blood pressure is staying to high.  Went to sports medicine and they told him it was too high and needed to be switched to another.  Pharmacy:  [facility] on [address].  Please call patient and let him know when done.

## 2024-06-16 NOTE — ED Provider Notes (Signed)
 " [facility]    CSN: [id number] Arrival date & time: 06/16/24  0803      History   Chief Complaint Chief Complaint  Patient presents with   Finger Injury    HPI Alejandro Middleton is a 64 y.o. male.   Alejandro Middleton is a 64 y.o. male presenting for chief complaint of finger injury to the right middle finger that happened at approximately 11pm-12 midnight last night (8-10 hours ago).  He was helping a friend move a couch when the couch accidentally dropped onto his right middle finger causing subungual hematoma to the affected nail.  Reports significant pain and swelling of the right diffuse middle digit.  Denies distal numbness, previous injury to the right middle finger, and open wound as a result of injury.   Blood pressure elevated in triage. Patient takes losartan -hydrochlorothiazide  and amlodipine .  He has taken his blood pressure medications this morning.  Denies CP, SOB, palpitations, dizziness, extremity weakness, headache, vision changes, and paresthesias.      Past Medical History:  Diagnosis Date   Erectile dysfunction    History of renal stone 01/14/2012   Hyperlipidemia    Hypertension    Inguinal hernia    bilateral   OSA (obstructive sleep apnea)    s/p surgery    Patient Active Problem List   Diagnosis Date Noted   Low back pain 04/26/2024   Allergic rhinitis 09/29/2022   B12 deficiency 09/28/2022   Plantar fasciitis of left foot 04/01/2022   Insomnia 03/28/2022   Vitamin D  deficiency 09/23/2021   Pain of left heel 07/25/2021   Encounter for well adult exam with abnormal findings 05/08/2019   Bilateral inguinal hernia (BIH) s/p lap repair w mesh 05/02/2019 05/02/2019   Umbilical hernia s/p primary repair 05/02/2019 05/02/2019   Obesity 04/28/2012   Gross hematuria 01/14/2012   History of renal stone 01/14/2012   Hyperlipidemia 06/19/2009   SLEEP APNEA, OBSTRUCTIVE 06/19/2009   Essential hypertension 06/19/2009   ERECTILE DYSFUNCTION, 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 Aleene       Home Medications    Prior to Admission medications  Medication Sig Start Date End Date Taking? Authorizing Provider  amLODipine  (NORVASC ) 10 MG tablet Take 1 tablet (10 mg total) by mouth daily. 04/26/24   Norleen Lynwood ORN, MD  aspirin  EC 81 MG tablet Take 1 tablet (81 mg total) by mouth daily. 01/15/16   Norleen Lynwood ORN, MD  cyclobenzaprine  (FLEXERIL ) 5 MG tablet Take 1 tablet (5 mg total) by mouth 3 (three) times daily as needed. 04/26/24   Norleen Lynwood ORN, MD  losartan -hydrochlorothiazide  (HYZAAR ) 100-12.5 MG tablet Take 1 tablet by mouth daily. 04/26/24   Norleen Lynwood ORN, MD  rosuvastatin  (CRESTOR ) 40 MG tablet TAKE 1 TABLET(40 MG) BY MOUTH DAILY 04/26/24   Norleen Lynwood ORN, MD    Family History Family History  Problem Relation Age of Onset   Cancer Sister        breast   Hypertension Mother    Glaucoma Mother    Stroke Mother 24   Heart attack Father 19  Social History Social History[1]   Allergies   Patient has no known allergies.   Review of Systems Review of Systems Per HPI  Physical Exam Triage Vital Signs ED Triage Vitals  Encounter Vitals Group     BP 06/16/24 0819 (!) 171/83     Girls Systolic BP Percentile --      Girls Diastolic BP Percentile --      Boys Systolic BP Percentile --      Boys Diastolic BP Percentile --      Pulse Rate 06/16/24 0819 73     Resp 06/16/24 0819 18     Temp 06/16/24 0819 98.2 F (36.8 C)     Temp Source 06/16/24 0819 Oral     SpO2 06/16/24 0819 96 %     Weight --      Height --      Head Circumference --      Peak Flow --       Pain Score 06/16/24 0817 10     Pain Loc --      Pain Education --      Exclude from Growth Chart --    No data found.  Updated Vital Signs BP (!) 171/83 (BP Location: Right Arm)   Pulse 73   Temp 98.2 F (36.8 C) (Oral)   Resp 18   SpO2 96%   Visual Acuity Right Eye Distance:   Left Eye Distance:   Bilateral Distance:    Right Eye Near:   Left Eye Near:    Bilateral Near:     Physical Exam Vitals and nursing note reviewed.  Constitutional:      Appearance: He is not ill-appearing or toxic-appearing.  HENT:     Head: Normocephalic and atraumatic.     Right Ear: Hearing and external ear normal.     Left Ear: Hearing and external ear normal.     Nose: Nose normal.     Mouth/Throat:     Lips: Pink.  Eyes:     General: Lids are normal. Vision grossly intact. Gaze aligned appropriately.     Extraocular Movements: Extraocular movements intact.     Conjunctiva/sclera: Conjunctivae normal.  Pulmonary:     Effort: Pulmonary effort is normal.  Musculoskeletal:     Right hand: Swelling and tenderness present. Decreased range of motion. Normal strength. Normal sensation. There is no disruption of two-point discrimination. Normal capillary refill. Normal pulse.     Left hand: Normal.     Cervical back: Neck supple.     Comments: Right middle finger: Significant pain and swelling to the distal portion of the right middle finger.  Subungual hematoma to the proximal portion of the right middle finger nail. +2 right radial pulse, less than 2 cap refill. Sensation and strength intact to distal right upper extremity/affected digit.   Skin:    General: Skin is warm and dry.     Capillary Refill: Capillary refill takes less than 2 seconds.     Findings: No rash.  Neurological:     General: No focal deficit present.     Mental Status: He is alert and oriented to person, place, and time. Mental status is at baseline.     Cranial Nerves: No dysarthria or facial asymmetry.   Psychiatric:        Mood and Affect: Mood normal.        Speech: Speech normal.        Behavior: Behavior normal.  Thought Content: Thought content normal.        Judgment: Judgment normal.      UC Treatments / Results  Labs (all labs ordered are listed, but only abnormal results are displayed) Labs Reviewed - No data to display  EKG   Radiology DG Finger Middle Right Result Date: 06/16/2024 EXAM: 3 VIEW(S) XRAY OF THE RIGHT 3RD FINGER 06/16/2024 08:45:54 AM COMPARISON: None available. CLINICAL HISTORY: 64 year old male with crush injury to right long finger from moving furniture, resulting in subungual hematoma. FINDINGS: BONES AND JOINTS: Mildly comminuted and displaced fracture through the distal tuft of the right long finger distal phalanx. No malalignment. SOFT TISSUES: Soft tissue swelling. IMPRESSION: 1. Mildly comminuted and displaced tuft fracture of the right third distal phalanx. 2. Soft tissue swelling. Electronically signed by: Helayne Hurst MD 06/16/2024 08:51 AM EST RP Workstation: [id number]    Procedures Procedures (including critical care time)  Medications Ordered in UC Medications - No data to display  Initial Impression / Assessment and Plan / UC Course  I have reviewed the triage vital signs and the nursing notes.  Pertinent labs & imaging results that were available during my care of the patient were reviewed by me and considered in my medical decision making (see chart for details).   1.  Subungual hematoma of the right ring finger, closed comminuted fracture of distal phalanx of finger Right middle finger x-ray shows mildly comminuted and displaced fracture through the distal tuft of the right middle finger distal phalanx.  We did not attempt trephination today as there is a fracture to the distal tuft and this would create an open fracture increasing risk for infection.  Finger has been splinted, advised rest, ice, elevation, and follow-up with  Dr. Ahmad hand specialist via walking referral.  No signs of compartment syndrome today, neurovascularly intact to distal affected digit. Compartment syndrome/other ER return precautions discussed.  Counseled patient on potential for adverse effects with medications prescribed/recommended today, strict ER and return-to-clinic precautions discussed, patient verbalized understanding.    Final Clinical Impressions(s) / UC Diagnoses   Final diagnoses:  Subungual hematoma of right ring finger  Closed comminuted fracture of distal phalanx of finger     Discharge Instructions      You broke your finger. Keep the finger splint on all the time unless you are washing your hands.  Schedule a follow-up appointment with Dr. Shari hand specialist for follow-up in 5-7 days.  Apply ice to the finger 20 minutes on 20 minutes off and elevate the hand to reduce swelling.  Tylenol  1,000mg  every 6 hours as needed for pain and swelling.   If you have numbness/tingling to the tip of the finger, if the swelling worsens, or if you notice the tip of your finger is significantly colder than your other fingers, please go to the ER as this could mean you have lost blood flow to the tip of the finger due to swelling.     ED Prescriptions   None    PDMP not reviewed this encounter.    [1]  Social History Tobacco Use   Smoking status: Never   Smokeless tobacco: Never   Tobacco comments:    high school only  Vaping Use   Vaping status: Never Used  Substance Use Topics   Alcohol use: Not Currently   Drug use: No     Enedelia Dorna HERO, FNP 06/16/24 [phone]  "

## 2024-06-16 NOTE — Discharge Instructions (Addendum)
 You broke your finger. Keep the finger splint on all the time unless you are washing your hands.  Schedule a follow-up appointment with Dr. Shari hand specialist for follow-up in 5-7 days.  Apply ice to the finger 20 minutes on 20 minutes off and elevate the hand to reduce swelling.  Tylenol  1,000mg  every 6 hours as needed for pain and swelling.   If you have numbness/tingling to the tip of the finger, if the swelling worsens, or if you notice the tip of your finger is significantly colder than your other fingers, please go to the ER as this could mean you have lost blood flow to the tip of the finger due to swelling.

## 2024-06-16 NOTE — Telephone Encounter (Signed)
 I am concerned about the need for change bc the patient has a new fracture with bone pain today, which can lead to higher BP  Please continue to monitor and let us  know next week if still increased over 140/90

## 2024-06-16 NOTE — ED Triage Notes (Signed)
 Patient present for right middle finger injury x 1 day.  Patient smashed his finger yesterday helping someone move.  Patient has not taken any medication however he applied ice to finger.

## 2024-06-16 NOTE — Telephone Encounter (Signed)
 Communicated to patient PCP response and advice. Patient expressed understanding
